# Patient Record
Sex: Female | Born: 1980 | Race: White | Hispanic: No | Marital: Married | Smoking: Current every day smoker
Health system: Southern US, Community
[De-identification: ages and names within clinical notes are randomized; demographics above are authoritative.]

## PROBLEM LIST (undated history)

## (undated) DIAGNOSIS — O24419 Gestational diabetes mellitus in pregnancy, unspecified control: Secondary | ICD-10-CM

## (undated) DIAGNOSIS — R000 Tachycardia, unspecified: Secondary | ICD-10-CM

## (undated) HISTORY — PX: HERNIA REPAIR: SHX51

## (undated) HISTORY — DX: Tachycardia, unspecified: R00.0

---

## 2004-01-28 ENCOUNTER — Other Ambulatory Visit: Admission: RE | Admit: 2004-01-28 | Discharge: 2004-01-28 | Payer: Self-pay | Admitting: Internal Medicine

## 2009-10-18 ENCOUNTER — Emergency Department (HOSPITAL_COMMUNITY): Admission: EM | Admit: 2009-10-18 | Discharge: 2009-10-18 | Payer: Self-pay | Admitting: Emergency Medicine

## 2010-06-19 LAB — CBC
HCT: 39.2 % (ref 36.0–46.0)
Hemoglobin: 13.1 g/dL (ref 12.0–15.0)
MCHC: 33.5 g/dL (ref 30.0–36.0)
MCV: 84.2 fL (ref 78.0–100.0)
Platelets: 280 10*3/uL (ref 150–400)
RBC: 4.66 MIL/uL (ref 3.87–5.11)
RDW: 13.9 % (ref 11.5–15.5)

## 2010-06-19 LAB — COMPREHENSIVE METABOLIC PANEL
AST: 17 U/L (ref 0–37)
Albumin: 3.5 g/dL (ref 3.5–5.2)
Alkaline Phosphatase: 63 U/L (ref 39–117)
CO2: 27 mEq/L (ref 19–32)
Chloride: 106 mEq/L (ref 96–112)
Creatinine, Ser: 0.93 mg/dL (ref 0.4–1.2)
GFR calc Af Amer: >60 mL/min (ref >60)
Sodium: 139 mEq/L (ref 135–145)
Total Bilirubin: 0.4 mg/dL (ref 0.3–1.2)

## 2010-06-19 LAB — URINE MICROSCOPIC-ADD ON

## 2010-06-19 LAB — DIFFERENTIAL
Eosinophils Relative: 2 % (ref 0–5)
Lymphs Abs: 3.3 10*3/uL (ref 0.7–4.0)

## 2010-06-19 LAB — URINALYSIS, ROUTINE W REFLEX MICROSCOPIC

## 2010-07-31 ENCOUNTER — Emergency Department (HOSPITAL_COMMUNITY)
Admission: EM | Admit: 2010-07-31 | Discharge: 2010-07-31 | Disposition: A | Discharge status: Home / Self Care | Payer: BC Managed Care – PPO | Attending: Emergency Medicine | Admitting: Emergency Medicine

## 2010-07-31 ENCOUNTER — Emergency Department (HOSPITAL_COMMUNITY): Payer: BC Managed Care – PPO

## 2010-07-31 DIAGNOSIS — R42 Dizziness and giddiness: Secondary | ICD-10-CM | POA: Insufficient documentation

## 2010-07-31 DIAGNOSIS — F17200 Nicotine dependence, unspecified, uncomplicated: Secondary | ICD-10-CM | POA: Insufficient documentation

## 2010-07-31 DIAGNOSIS — R0609 Other forms of dyspnea: Secondary | ICD-10-CM | POA: Insufficient documentation

## 2010-07-31 DIAGNOSIS — R0602 Shortness of breath: Secondary | ICD-10-CM | POA: Insufficient documentation

## 2010-07-31 DIAGNOSIS — R0989 Other specified symptoms and signs involving the circulatory and respiratory systems: Secondary | ICD-10-CM | POA: Insufficient documentation

## 2010-07-31 DIAGNOSIS — R079 Chest pain, unspecified: Secondary | ICD-10-CM | POA: Insufficient documentation

## 2010-07-31 LAB — POCT I-STAT, CHEM 8
Calcium, Ion: 1.08 mmol/L — ABNORMAL LOW (ref 1.12–1.32)
Creatinine, Ser: 0.8 mg/dL (ref 0.4–1.2)
Glucose, Bld: 113 mg/dL — ABNORMAL HIGH (ref 70–99)
HCT: 38 % (ref 36.0–46.0)
Sodium: 140 mEq/L (ref 135–145)
TCO2: 25 mmol/L (ref 0–100)

## 2010-07-31 LAB — D-DIMER, QUANTITATIVE: D-Dimer, Quant: 0.23 ug/mL-FEU (ref 0.00–0.48)

## 2010-08-04 ENCOUNTER — Encounter: Payer: Self-pay | Admitting: Cardiovascular Disease

## 2010-08-04 ENCOUNTER — Ambulatory Visit (INDEPENDENT_AMBULATORY_CARE_PROVIDER_SITE_OTHER): Payer: BC Managed Care – PPO | Admitting: Cardiovascular Disease

## 2010-08-04 VITALS — BP 126/82 | HR 86 | Ht 67.0 in | Wt 204.0 lb

## 2010-08-04 DIAGNOSIS — R000 Tachycardia, unspecified: Secondary | ICD-10-CM

## 2010-08-04 NOTE — Progress Notes (Signed)
Claire Jackson Date of Birth  02/01/1981 [facility] / [facility] [address]     [address] [place], [place]  [address] [phone]  Fax  [phone]  History of Present Illness:  Claire Jackson is a young female who presents today for further evaluation of chest pain and tachycardia. Claire Jackson has been overweight for a couple of years. She's been trying to lose some weight. She was tried on ephedrine. She noticed that her heart rate would stay elevated after she walked on the treadmill.   She never had the chest pain while walking on the treadmill but would have occasional episodes of chest discomfort. She seen in virtually him and workup there was negative.  She denies any syncope or presyncope.  No current outpatient prescriptions on file prior to visit.   No Known Allergies  Past Medical History  Diagnosis Date  . Heart rate fast     No past surgical history on file.  History  Smoking status  . Former Smoker  . Quit date: 08/03/2010  Smokeless tobacco  . Not on file    History  Alcohol Use No    Family History  Problem Relation Age of Onset  . Mitral valve prolapse Mother     Reviw of Systems:  Reviewed in the HPI.  All other systems are negative.  Physical Exam: BP 126/82  Pulse 86  Ht 5\' 7"  (1.702 m)  Wt 204 lb (92.534 kg)  BMI 31.95 kg/m2  LMP 07/03/2010 The patient is alert and oriented x 3.  The mood and affect are normal.  The skin is warm and dry.  Color is normal.  The HEENT exam reveals that the sclera are nonicteric.  The mucous membranes are moist.  The carotids are 2+ without bruits.  There is no thyromegaly.  There is no JVD.  The lungs are clear.  The chest wall is non tender.  The heart exam reveals a regular rate with a normal S1 and S2.  There are no murmurs, gallops, or rubs.  The PMI is not displaced.   Abdominal exam reveals good bowel sounds.  There is no guarding or rebound.  There is no hepatosplenomegaly or tenderness.   There are no masses.  Exam of the legs reveal no clubbing, cyanosis, or edema.  The legs are without rashes.  The distal pulses are intact.  Cranial nerves II - XII are intact.  Motor and sensory functions are intact.  The gait is normal.  ECG: Her EKG from the [facility] emergency room reveals normal sinus rhythm. She has no ST or T wave changes. Assessment / Plan:

## 2010-08-04 NOTE — Assessment & Plan Note (Signed)
Claire Jackson's heart rate was probably accelerated by a combination of several issues. She is somewhat deconditioned. In addition she was taking ephedrine which would tend to make her heart rate after. Her symptoms are much better after she stopped ephedrine. I've asked her to stop all diet pills. I've asked her to exercise on a regular basis. I've encouraged her to improve with a good exercise and weight loss program. I've not scheduled her a return appointment but would be happy see her in the future if needed.

## 2011-07-19 IMAGING — CT CT ABD-PELV W/ CM
3 of 4 series · 14 of 32 positions shown, 19 images · IV contrast (water & 80ml omni 300)
Comparison: None.

CLINICAL DATA: Abdominal pain and swelling.

CT ABDOMEN AND PELVIS WITH CONTRAST
TECHNIQUE: Multidetector CT imaging of the abdomen and pelvis was
performed following the standard protocol during bolus
administration of intravenous contrast.
Contrast: 80 ml Xmnipaque-QYY.

[Series 2: routine abdomen · axial · 0.88mm/px · z∈[-402,-117]mm · 4 of 96 slices shown, 9 images]
[im 20/96  soft-tissue]
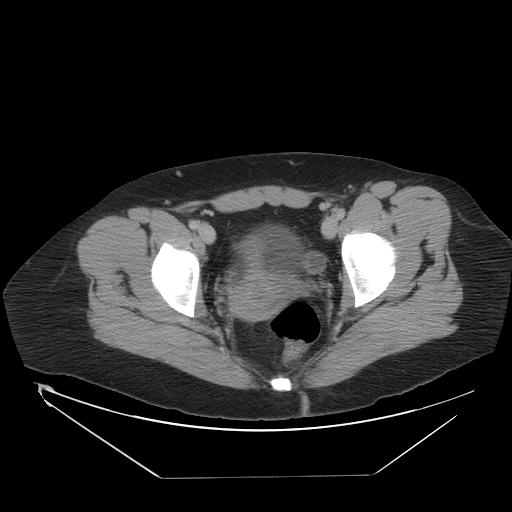
[im 20/96  lung]
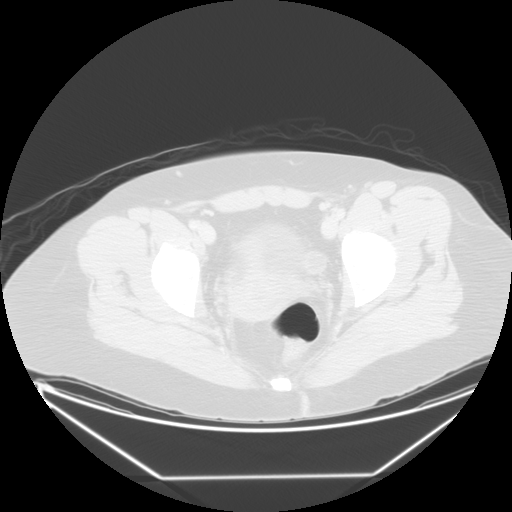
[im 20/96  bone]
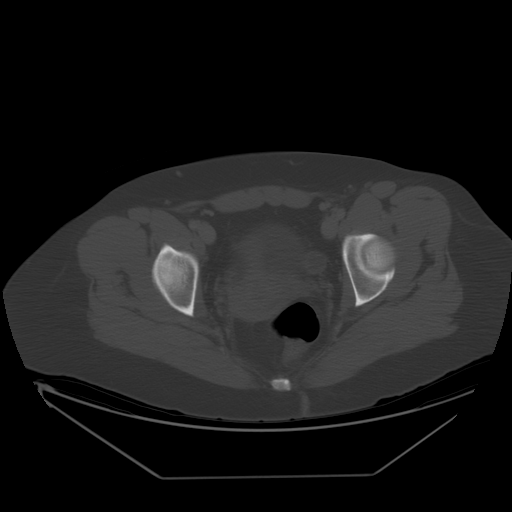
[im 39/96  soft-tissue]
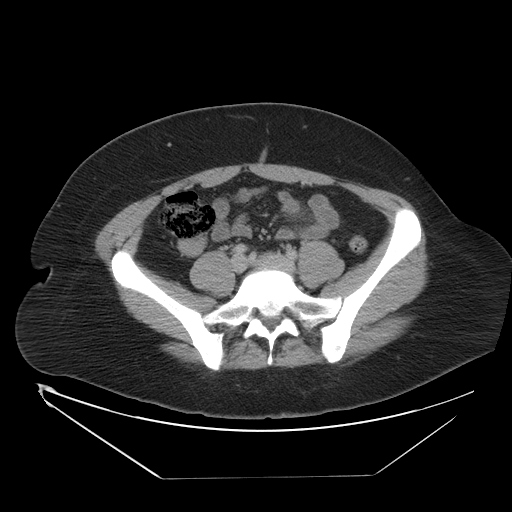
[im 39/96  lung]
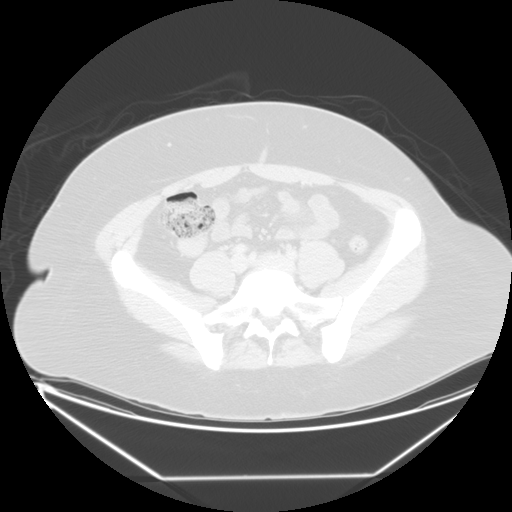
[im 58/96  soft-tissue]
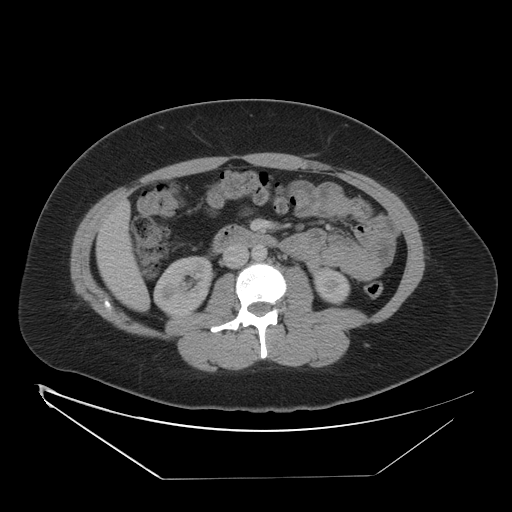
[im 58/96  lung]
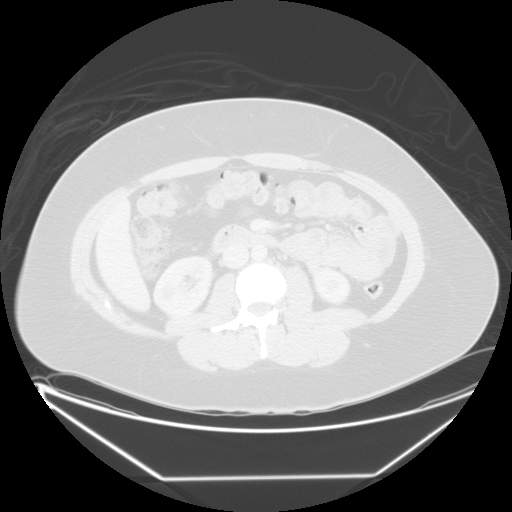
[im 77/96  soft-tissue]
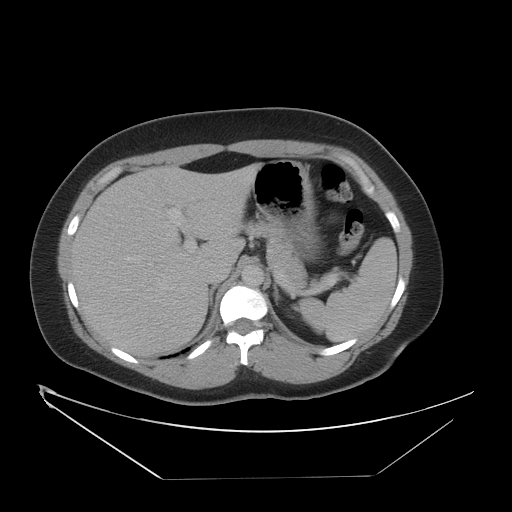
[im 77/96  lung]
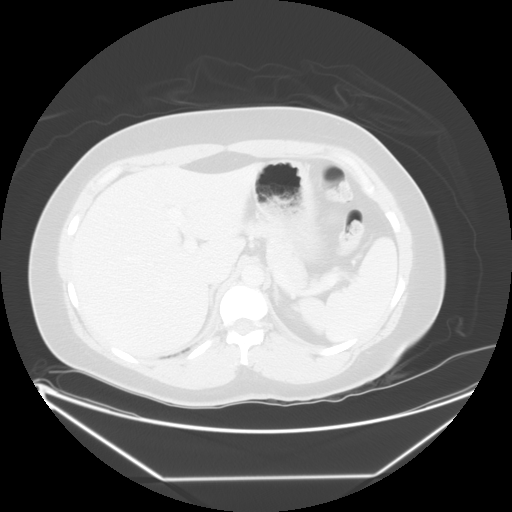

[Series 401: reformatted · coronal · 0.98mm/px · 2 of 161 slices shown (1 of 2)]
[im 18/161  soft-tissue]
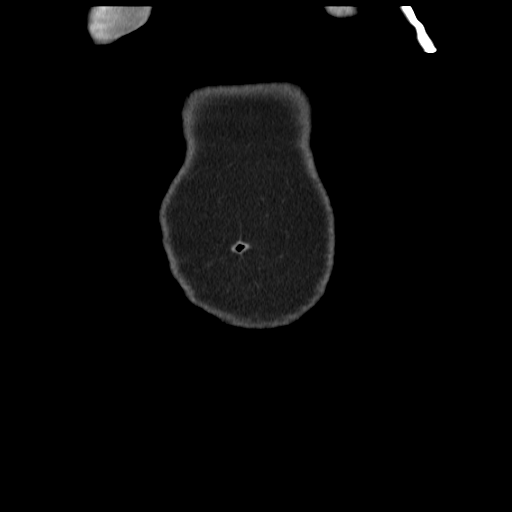
[im 36/161  soft-tissue]
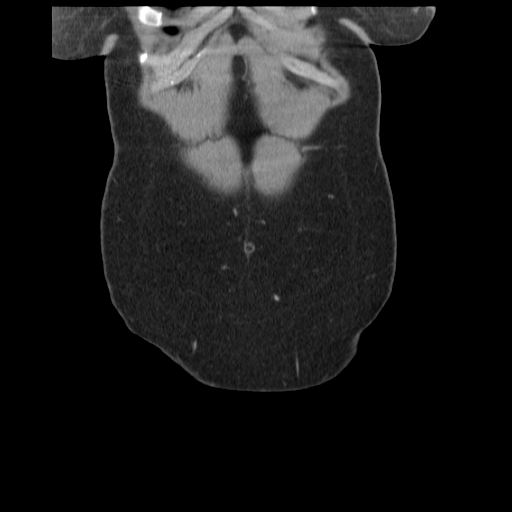

[Series 402: reformatted · sagittal · 0.98mm/px · 8 of 224 slices shown (2 of 2)]
[im 18/224  soft-tissue]
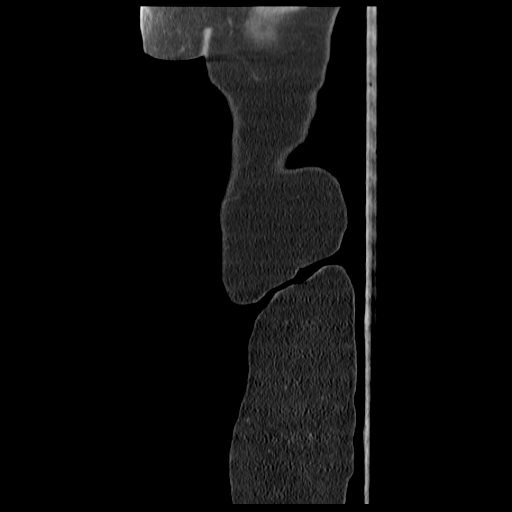
[im 52/224  soft-tissue]
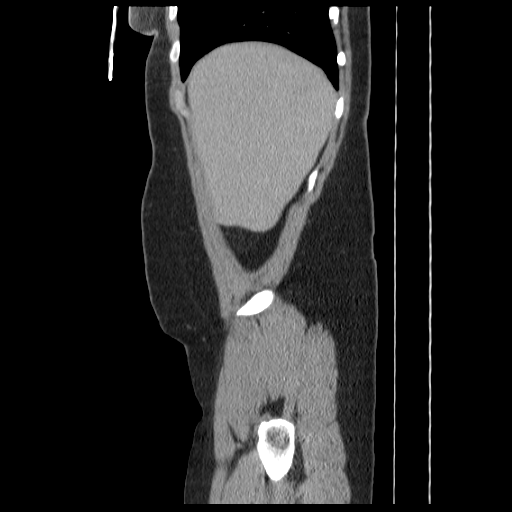
[im 69/224  soft-tissue]
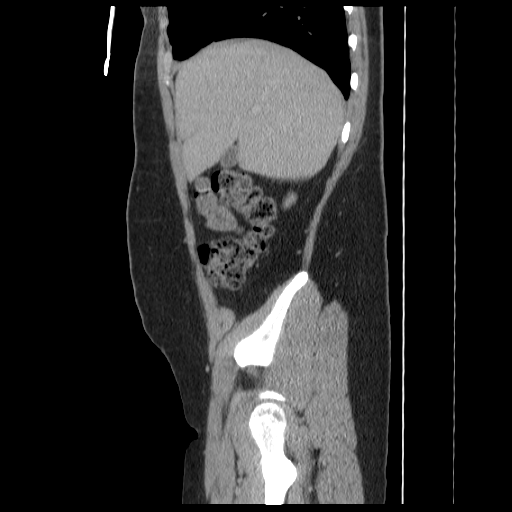
[im 103/224  soft-tissue]
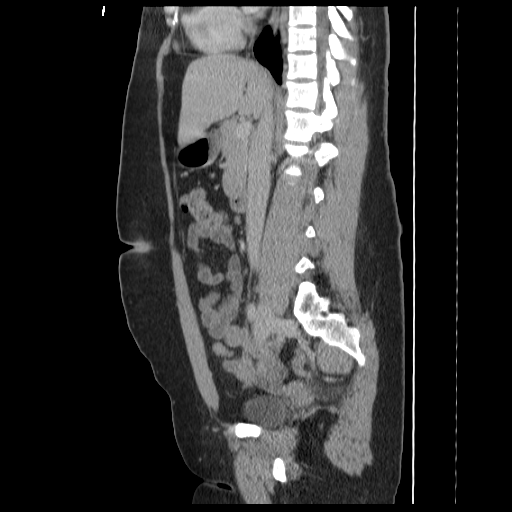
[im 121/224  soft-tissue]
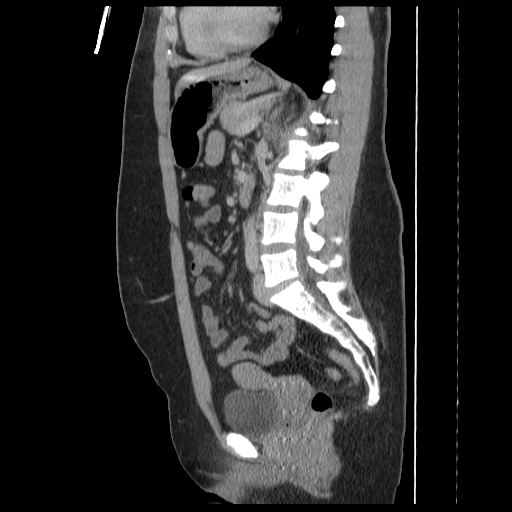
[im 155/224  soft-tissue]
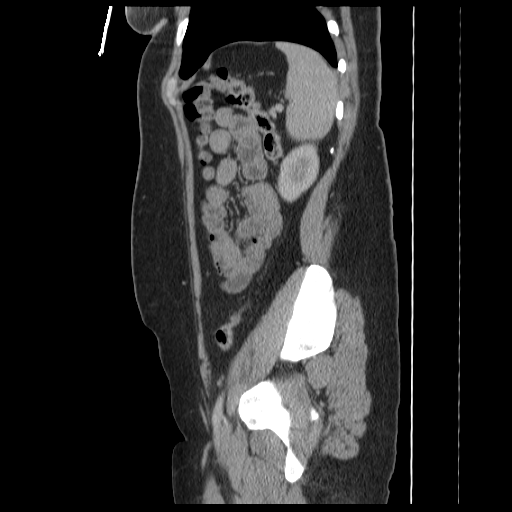
[im 172/224  soft-tissue]
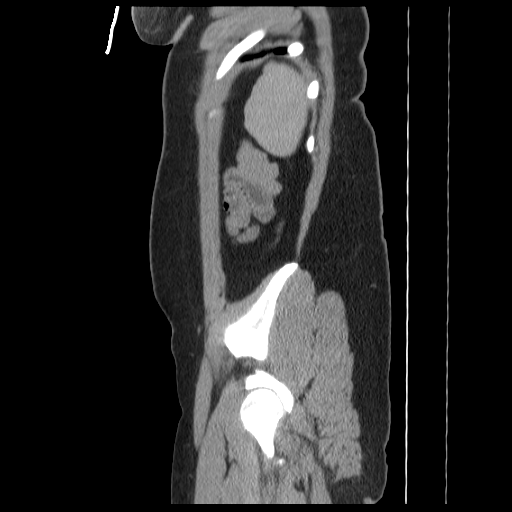
[im 206/224  soft-tissue]
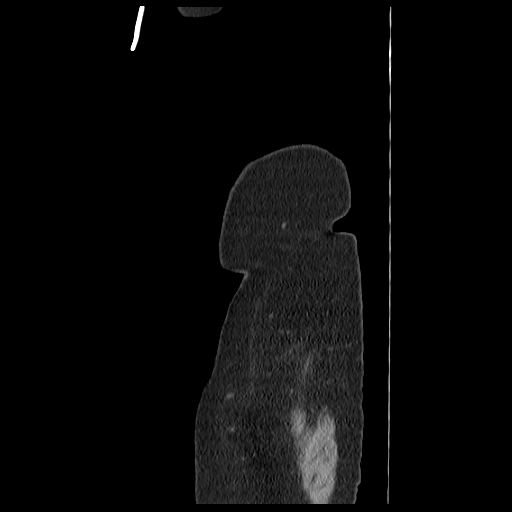

[14 of 32 positions shown; findings below may reference images not displayed]

FINDINGS: Dependent atelectasis of the lung bases.  Liver within
normal limits.  Gallbladder contracted.  The spleen unremarkable.
Normal kidneys.  Adrenal glands normal.  Pancreas unremarkable.
Stomach and proximal small bowel normal.  Small appendicolith.  No
inflammatory changes of the appendix.  No free fluid or free air.
The colon is unremarkable.  Physiologic appearance of the uterus
and adnexa.  Urinary bladder normal.  Both ureters appear within
normal limits.
IMPRESSION: No acute abnormality.

## 2011-09-16 ENCOUNTER — Ambulatory Visit: Payer: Self-pay

## 2011-09-16 ENCOUNTER — Other Ambulatory Visit: Payer: Self-pay | Admitting: Occupational Medicine

## 2011-09-16 DIAGNOSIS — R52 Pain, unspecified: Secondary | ICD-10-CM

## 2013-11-27 ENCOUNTER — Encounter (HOSPITAL_COMMUNITY): Payer: Self-pay | Admitting: Emergency Medicine

## 2013-11-27 ENCOUNTER — Emergency Department (HOSPITAL_COMMUNITY)
Admission: EM | Admit: 2013-11-27 | Discharge: 2013-11-27 | Disposition: A | Discharge status: Home / Self Care | Payer: 59 | Attending: Emergency Medicine | Admitting: Emergency Medicine

## 2013-11-27 DIAGNOSIS — R209 Unspecified disturbances of skin sensation: Secondary | ICD-10-CM | POA: Insufficient documentation

## 2013-11-27 DIAGNOSIS — R12 Heartburn: Secondary | ICD-10-CM | POA: Insufficient documentation

## 2013-11-27 DIAGNOSIS — R0789 Other chest pain: Secondary | ICD-10-CM

## 2013-11-27 DIAGNOSIS — Z3202 Encounter for pregnancy test, result negative: Secondary | ICD-10-CM | POA: Diagnosis not present

## 2013-11-27 DIAGNOSIS — R071 Chest pain on breathing: Secondary | ICD-10-CM | POA: Insufficient documentation

## 2013-11-27 DIAGNOSIS — R110 Nausea: Secondary | ICD-10-CM | POA: Insufficient documentation

## 2013-11-27 DIAGNOSIS — R079 Chest pain, unspecified: Secondary | ICD-10-CM | POA: Insufficient documentation

## 2013-11-27 DIAGNOSIS — Z87891 Personal history of nicotine dependence: Secondary | ICD-10-CM | POA: Insufficient documentation

## 2013-11-27 LAB — LIPASE, BLOOD: LIPASE: 21 U/L (ref 11–59)

## 2013-11-27 LAB — HEPATIC FUNCTION PANEL
ALBUMIN: 4 g/dL (ref 3.5–5.2)
ALT: 20 U/L (ref 0–35)
AST: 28 U/L (ref 0–37)
Alkaline Phosphatase: 69 U/L (ref 39–117)
BILIRUBIN TOTAL: 0.3 mg/dL (ref 0.3–1.2)
Total Protein: 7.2 g/dL (ref 6.0–8.3)

## 2013-11-27 LAB — URINALYSIS, ROUTINE W REFLEX MICROSCOPIC
BILIRUBIN URINE: NEGATIVE
Glucose, UA: NEGATIVE mg/dL
KETONES UR: NEGATIVE mg/dL
Leukocytes, UA: NEGATIVE
Nitrite: NEGATIVE
PH: 5.5 (ref 5.0–8.0)
Protein, ur: NEGATIVE mg/dL
SPECIFIC GRAVITY, URINE: 1.008 (ref 1.005–1.030)
UROBILINOGEN UA: 0.2 mg/dL (ref 0.0–1.0)

## 2013-11-27 LAB — BASIC METABOLIC PANEL
ANION GAP: 14 (ref 5–15)
BUN: 7 mg/dL (ref 6–23)
CHLORIDE: 100 meq/L (ref 96–112)
CO2: 23 mEq/L (ref 19–32)
Calcium: 8.8 mg/dL (ref 8.4–10.5)
Creatinine, Ser: 0.83 mg/dL (ref 0.50–1.10)
GFR calc Af Amer: >90 mL/min (ref >90)
Glucose, Bld: 88 mg/dL (ref 70–99)
Potassium: 4.1 mEq/L (ref 3.7–5.3)
SODIUM: 137 meq/L (ref 137–147)

## 2013-11-27 LAB — URINE MICROSCOPIC-ADD ON

## 2013-11-27 LAB — CBC
HCT: 39.6 % (ref 36.0–46.0)
Hemoglobin: 12.9 g/dL (ref 12.0–15.0)
MCH: 26.8 pg (ref 26.0–34.0)
MCHC: 32.6 g/dL (ref 30.0–36.0)
MCV: 82.2 fL (ref 78.0–100.0)
Platelets: 282 10*3/uL (ref 150–400)
RBC: 4.82 MIL/uL (ref 3.87–5.11)
RDW: 13.8 % (ref 11.5–15.5)
WBC: 10.1 10*3/uL (ref 4.0–10.5)

## 2013-11-27 LAB — I-STAT TROPONIN, ED
TROPONIN I, POC: 0 ng/mL (ref 0.00–0.08)
TROPONIN I, POC: 0 ng/mL (ref 0.00–0.08)

## 2013-11-27 LAB — CBG MONITORING, ED: GLUCOSE-CAPILLARY: 89 mg/dL (ref 70–99)

## 2013-11-27 LAB — POC URINE PREG, ED: Preg Test, Ur: NEGATIVE

## 2013-11-27 LAB — D-DIMER, QUANTITATIVE (NOT AT ARMC): D DIMER QUANT: 0.35 ug{FEU}/mL (ref 0.00–0.48)

## 2013-11-27 MED ORDER — KETOROLAC TROMETHAMINE 15 MG/ML IJ SOLN
15.0000 mg | Freq: Once | INTRAMUSCULAR | Status: AC
Start: 2013-11-27 — End: 2013-11-27
  Administered 2013-11-27: 15 mg via INTRAVENOUS
  Filled 2013-11-27: qty 1

## 2013-11-27 MED ORDER — GI COCKTAIL ~~LOC~~
30.0000 mL | Freq: Once | ORAL | Status: AC
  Administered 2013-11-27: 30 mL via ORAL
  Filled 2013-11-27: qty 30

## 2013-11-27 NOTE — Discharge Instructions (Signed)

## 2013-11-27 NOTE — ED Notes (Addendum)
Pt reports mid sternal cp (pressure) that started at 1450 today. Shortly after she started having sob, nausea and numbness/tingling to L hand. Tingling is up to elbow now. Also tingling in neck. Cp increases with deep breathing. Was told by urgent care to come to ER. Pt conscious alert&oriented. Pt also sts she has lump to L ribs that she was going to go to doctor for later this week. Pt given 324 asa pta.

## 2013-11-27 NOTE — ED Notes (Signed)
Dr. Gwendolyn Grant in to examine pt. No code stroke at this time.

## 2013-11-27 NOTE — ED Provider Notes (Signed)
CSN: [id number]     Arrival date & time 11/27/13  1638 History   First MD Initiated Contact with Patient 11/27/13 1751     Chief Complaint  Patient presents with  . Chest Pain  . Numbness    left arm     (Consider location/radiation/quality/duration/timing/severity/associated sxs/prior Treatment) Patient is a 33 y.o. female presenting with chest pain.  Chest Pain Pain location:  Substernal area Pain quality: pressure   Pain radiates to:  Does not radiate Pain radiates to the back: no   Pain severity:  Moderate Onset quality:  Gradual Duration:  1 day Timing:  Constant Progression:  Partially resolved Chronicity:  New Context: eating   Relieved by:  Nothing Worsened by:  Nothing tried Ineffective treatments:  None tried Associated symptoms: heartburn and nausea   Associated symptoms: no abdominal pain, no back pain, no cough, no dizziness, no fever, no lower extremity edema, no numbness, no shortness of breath and not vomiting     Past Medical History  Diagnosis Date  . Heart rate fast    History reviewed. No pertinent past surgical history. Family History  Problem Relation Age of Onset  . Mitral valve prolapse Mother    History  Substance Use Topics  . Smoking status: Former Smoker    Quit date: 08/03/2010  . Smokeless tobacco: Not on file  . Alcohol Use: No   OB History   Grav Para Term Preterm Abortions TAB SAB Ect Mult Living                 Review of Systems  Constitutional: Negative for fever and chills.  HENT: Negative for congestion, rhinorrhea and sore throat.   Eyes: Negative for photophobia and visual disturbance.  Respiratory: Negative for cough and shortness of breath.   Cardiovascular: Positive for chest pain. Negative for leg swelling.  Gastrointestinal: Positive for heartburn and nausea. Negative for vomiting, abdominal pain, diarrhea and constipation.  Endocrine: Negative for polyphagia and polyuria.  Genitourinary: Negative for dysuria,  flank pain, vaginal bleeding, vaginal discharge and enuresis.  Musculoskeletal: Negative for back pain and gait problem.  Skin: Negative for color change and rash.  Neurological: Negative for dizziness, syncope, light-headedness and numbness.  Hematological: Negative for adenopathy. Does not bruise/bleed easily.  All other systems reviewed and are negative.     Allergies  Review of patient's allergies indicates no known allergies.  Home Medications   Prior to Admission medications   Not on File   BP 110/71  Pulse 74  Temp(Src) 98.4 F (36.9 C) (Oral)  Resp 19  SpO2 99%  LMP 11/15/2013 Physical Exam  Vitals reviewed. Constitutional: She is oriented to person, place, and time. She appears well-developed and well-nourished.  HENT:  Head: Normocephalic and atraumatic.  Right Ear: External ear normal.  Left Ear: External ear normal.  Eyes: Conjunctivae and EOM are normal. Pupils are equal, round, and reactive to light.  Neck: Normal range of motion. Neck supple.  Cardiovascular: Normal rate, regular rhythm, normal heart sounds and intact distal pulses.   Pulmonary/Chest: Effort normal and breath sounds normal.  Abdominal: Soft. Bowel sounds are normal. There is no tenderness.  Musculoskeletal: Normal range of motion.  Neurological: She is alert and oriented to person, place, and time.  Skin: Skin is warm and dry.    ED Course  Procedures (including critical care time) Labs Review Labs Reviewed  URINALYSIS, ROUTINE W REFLEX MICROSCOPIC - Abnormal; Notable for the following:    Hgb urine dipstick SMALL (*)  All other components within normal limits  URINE MICROSCOPIC-ADD ON - Abnormal; Notable for the following:    Squamous Epithelial / LPF FEW (*)    All other components within normal limits  CBC  BASIC METABOLIC PANEL  HEPATIC FUNCTION PANEL  LIPASE, BLOOD  D-DIMER, QUANTITATIVE  I-STAT TROPOININ, ED  CBG MONITORING, ED  POC URINE PREG, ED  I-STAT TROPOININ,  ED    Imaging Review No results found.   EKG Interpretation   Date/Time:  Wednesday November 27 2013 16:45:04 EDT Ventricular Rate:  89 PR Interval:  166 QRS Duration: 88 QT Interval:  372 QTC Calculation: 452 R Axis:   83 Text Interpretation:  Normal sinus rhythm Normal ECG No significant change  was found Confirmed by Mirian Mo [phone]) on 11/27/2013 6:13:19 PM      MDM   Final diagnoses:  Chest pain, unspecified chest pain type  Chest wall pain    33 y.o. female  with pertinent PMH of prior tachycardia presents with chest pain, intermittent, associated with dyspnea.  She had some tingling in her l arm so came for further evaluation.  Symptoms non exertional, and she felt epigastric pain and need to burp.  She was given a gi cocktail and toradol and had near total relief of symptoms.  Labs as above drawn given pleuritic nature of pain and ? Swelling of legs after long car trip.  Paroxysmal nature within 6 hours, so delta troponin drawn as well. .    Labs and imaging as above reviewed. Delta troponin negative, ddimer negative, ECG as above with NSR.  Pt complained of a lump on L rib, which I can feel on palpation, however this is nonspecific and she has PCP fu for same.  Pt was given standard return precautions for chest pain, voiced understanding, and agreed to fu.  Likely reflux vs MSK.    1. Chest pain, unspecified chest pain type   2. Chest wall pain         Mirian Mo, MD 11/27/13 [phone]

## 2017-01-05 ENCOUNTER — Encounter (HOSPITAL_COMMUNITY): Payer: Self-pay | Admitting: Emergency Medicine

## 2017-01-05 ENCOUNTER — Emergency Department (HOSPITAL_COMMUNITY)
Admission: EM | Admit: 2017-01-05 | Discharge: 2017-01-06 | Disposition: A | Discharge status: Home / Self Care | Payer: Commercial Managed Care - PPO | Attending: Emergency Medicine | Admitting: Emergency Medicine

## 2017-01-05 DIAGNOSIS — R109 Unspecified abdominal pain: Secondary | ICD-10-CM | POA: Diagnosis not present

## 2017-01-05 DIAGNOSIS — Z87891 Personal history of nicotine dependence: Secondary | ICD-10-CM | POA: Insufficient documentation

## 2017-01-05 DIAGNOSIS — M545 Low back pain, unspecified: Secondary | ICD-10-CM

## 2017-01-05 LAB — COMPREHENSIVE METABOLIC PANEL
ALT: 16 U/L (ref 14–54)
AST: 19 U/L (ref 15–41)
Albumin: 3.9 g/dL (ref 3.5–5.0)
Alkaline Phosphatase: 73 U/L (ref 38–126)
Anion gap: 9 (ref 5–15)
BUN: 10 mg/dL (ref 6–20)
CHLORIDE: 105 mmol/L (ref 101–111)
CO2: 22 mmol/L (ref 22–32)
Calcium: 8.9 mg/dL (ref 8.9–10.3)
Creatinine, Ser: 0.98 mg/dL (ref 0.44–1.00)
GFR calc Af Amer: >60 mL/min (ref >60)
GFR calc non Af Amer: >60 mL/min (ref >60)
GLUCOSE: 136 mg/dL — AB (ref 65–99)
POTASSIUM: 3.9 mmol/L (ref 3.5–5.1)
SODIUM: 136 mmol/L (ref 135–145)
Total Bilirubin: 0.5 mg/dL (ref 0.3–1.2)
Total Protein: 6.9 g/dL (ref 6.5–8.1)

## 2017-01-05 LAB — URINALYSIS, ROUTINE W REFLEX MICROSCOPIC
BILIRUBIN URINE: NEGATIVE
Glucose, UA: NEGATIVE mg/dL
Ketones, ur: NEGATIVE mg/dL
LEUKOCYTES UA: NEGATIVE
NITRITE: NEGATIVE
Protein, ur: NEGATIVE mg/dL
Specific Gravity, Urine: 1.031 — ABNORMAL HIGH (ref 1.005–1.030)
pH: 5 (ref 5.0–8.0)

## 2017-01-05 LAB — CBC WITH DIFFERENTIAL/PLATELET
BASOS ABS: 0 10*3/uL (ref 0.0–0.1)
Basophils Relative: 0 %
EOS ABS: 0.3 10*3/uL (ref 0.0–0.7)
Eosinophils Relative: 2 %
HCT: 38.2 % (ref 36.0–46.0)
Hemoglobin: 12.3 g/dL (ref 12.0–15.0)
LYMPHS PCT: 21 %
Lymphs Abs: 3 10*3/uL (ref 0.7–4.0)
MCH: 25.8 pg — ABNORMAL LOW (ref 26.0–34.0)
MCHC: 32.2 g/dL (ref 30.0–36.0)
MCV: 80.3 fL (ref 78.0–100.0)
Monocytes Absolute: 0.8 10*3/uL (ref 0.1–1.0)
Monocytes Relative: 6 %
Neutro Abs: 10 10*3/uL — ABNORMAL HIGH (ref 1.7–7.7)
Neutrophils Relative %: 71 %
PLATELETS: 310 10*3/uL (ref 150–400)
RBC: 4.76 MIL/uL (ref 3.87–5.11)
RDW: 15.3 % (ref 11.5–15.5)
WBC: 14.1 10*3/uL — AB (ref 4.0–10.5)

## 2017-01-05 LAB — I-STAT BETA HCG BLOOD, ED (MC, WL, AP ONLY): I-stat hCG, quantitative: <5 m[IU]/mL (ref <5)

## 2017-01-05 NOTE — ED Triage Notes (Signed)
Patient reports low back pain for 2 weeks with mild malodorous urine , denies back injury , seen at an urgent care this morning at [place] suspects diverticulitis , denies emesis /fever this afternoon .

## 2017-01-06 ENCOUNTER — Emergency Department (HOSPITAL_COMMUNITY): Payer: Commercial Managed Care - PPO

## 2017-01-06 DIAGNOSIS — M545 Low back pain: Secondary | ICD-10-CM | POA: Diagnosis present

## 2017-01-06 DIAGNOSIS — R109 Unspecified abdominal pain: Secondary | ICD-10-CM | POA: Diagnosis not present

## 2017-01-06 DIAGNOSIS — Z87891 Personal history of nicotine dependence: Secondary | ICD-10-CM | POA: Diagnosis not present

## 2017-01-06 MED ORDER — KETOROLAC TROMETHAMINE 30 MG/ML IJ SOLN
30.0000 mg | Freq: Once | INTRAMUSCULAR | Status: AC
  Administered 2017-01-06: 30 mg via INTRAVENOUS
  Filled 2017-01-06: qty 1

## 2017-01-06 MED ORDER — METHOCARBAMOL 500 MG PO TABS: 500.0000 mg | ORAL_TABLET | Freq: Two times a day (BID) | ORAL | 0 refills | Status: DC

## 2017-01-06 MED ORDER — NAPROXEN 500 MG PO TABS: 500.0000 mg | ORAL_TABLET | Freq: Two times a day (BID) | ORAL | 0 refills | Status: AC

## 2017-01-06 MED ORDER — IOPAMIDOL (ISOVUE-300) INJECTION 61%
INTRAVENOUS | Status: AC
  Administered 2017-01-06: 100 mL
  Filled 2017-01-06: qty 100

## 2017-01-06 NOTE — Discharge Instructions (Signed)
Take the prescribed medication as directed.  Can also use heating pad/warm compress to see if it will help your back as well. Follow-up with your primary care doctor if any ongoing issues. Return to the ED for new or worsening symptoms.

## 2017-01-06 NOTE — ED Provider Notes (Signed)
[facility]-EMERGENCY DEPT Provider Note   CSN: [id number] Arrival date & time: 01/05/17  2000     History   Chief Complaint Chief Complaint  Patient presents with  . Back Pain  . Diverticulitis    HPI Claire Jackson is a 36 y.o. female.  The history is provided by the patient and medical records.    36 year old female sent here from urgent care with concern of possible diverticulitis. Reports she has had some ongoing back pain for the past 2 weeks. States mostly localized to the lower mid back along the spine. No injury, trauma, or falls. She reports she is also had some lower abdominal pain and decreased urine output. States she is urinating less at one time and less frequently than normal. She denies any dysuria or noted hematuria. She did have some nausea earlier today but no vomiting. No diarrhea. States at urgent care she had blood work done which showed an elevated white blood cell count and she had a fever of 101F.  No hx of diverticulitis or other abdominal problems.  Does report pinched nerve in her back about 17 years ago but no ongoing issues from this.  No numbness/weakness of the legs.  No bowel or bladder incontinence.  Past Medical History:  Diagnosis Date  . Heart rate fast     Patient Active Problem List   Diagnosis Date Noted  . Heart rate fast     History reviewed. No pertinent surgical history.  OB History    No data available       Home Medications    Prior to Admission medications   Not on File    Family History Family History  Problem Relation Age of Onset  . Mitral valve prolapse Mother     Social History Social History  Substance Use Topics  . Smoking status: Former Smoker    Quit date: 08/03/2010  . Smokeless tobacco: Never Used  . Alcohol use No     Allergies   Patient has no known allergies.   Review of Systems Review of Systems  Gastrointestinal: Positive for abdominal pain.  Musculoskeletal: Positive for back pain.  All other  systems reviewed and are negative.    Physical Exam Updated Vital Signs BP (!) 154/83   Pulse (!) 104   Temp 98.3 F (36.8 C) (Oral)   Resp 16   Ht  (1.702 m)   Wt 117.9 kg (260 lb)   LMP 12/16/2016   SpO2 100%   BMI 40.72 kg/m   Physical Exam  Constitutional: She is oriented to person, place, and time. She appears well-developed and well-nourished.  HENT:  Head: Normocephalic and atraumatic.  Mouth/Throat: Oropharynx is clear and moist.  Eyes: Pupils are equal, round, and reactive to light. Conjunctivae and EOM are normal.  Neck: Normal range of motion.  Cardiovascular: Normal rate, regular rhythm and normal heart sounds.   Pulmonary/Chest: Effort normal and breath sounds normal.  Abdominal: Soft. Bowel sounds are normal. There is tenderness in the right lower quadrant, suprapubic area and left lower quadrant. There is no CVA tenderness.  Musculoskeletal: Normal range of motion.  Reports pain along LS in the midline, no focal tenderness or deformities present, no signs of trauma, normal strength/sensation of both legs, normal gait  Neurological: She is alert and oriented to person, place, and time.  Skin: Skin is warm and dry.  Psychiatric: She has a normal mood and affect.  Nursing note and vitals reviewed.    ED Treatments /  Results  Labs (all labs ordered are listed, but only abnormal results are displayed) Labs Reviewed  CBC WITH DIFFERENTIAL/PLATELET - Abnormal; Notable for the following:       Result Value   WBC 14.1 (*)    MCH 25.8 (*)    Neutro Abs 10.0 (*)    All other components within normal limits  COMPREHENSIVE METABOLIC PANEL - Abnormal; Notable for the following:    Glucose, Bld 136 (*)    All other components within normal limits  URINALYSIS, ROUTINE W REFLEX MICROSCOPIC - Abnormal; Notable for the following:    Specific Gravity, Urine 1.031 (*)    Hgb urine dipstick MODERATE (*)    Bacteria, UA RARE (*)    Squamous Epithelial / LPF 0-5 (*)     All other components within normal limits  I-STAT BETA HCG BLOOD, ED ([facility], [facility], [facility] ONLY)    EKG  EKG Interpretation None       Radiology Ct Abdomen Pelvis W Contrast  Result Date: 01/06/2017 CLINICAL DATA:  Low back pain for 2 weeks. Suspected diverticulitis. EXAM: CT ABDOMEN AND PELVIS WITH CONTRAST TECHNIQUE: Multidetector CT imaging of the abdomen and pelvis was performed using the standard protocol following bolus administration of intravenous contrast. CONTRAST:  ISOVUE-300 IOPAMIDOL (ISOVUE-300) INJECTION 61% COMPARISON:  CT abdomen and pelvis October 18, 2009 FINDINGS: LOWER CHEST: Lung bases are clear. Included heart size is normal. No pericardial effusion. HEPATOBILIARY: 5 mm hypodensity RIGHT lobe of the liver, possible cyst. Contracted nonacute gallbladder. PANCREAS: Normal. SPLEEN: Normal. ADRENALS/URINARY TRACT: Kidneys are orthotopic, demonstrating symmetric enhancement. No nephrolithiasis, hydronephrosis or solid renal masses. 11 mm LEFT interpolar cyst. The unopacified ureters are normal in course and caliber. Urinary bladder is partially distended and unremarkable. Normal adrenal glands. STOMACH/BOWEL: Tiny hiatal hernia. The stomach, small and large bowel are normal in course and caliber without inflammatory changes. A few scattered colonic diverticulosis seen. Normal appendix. VASCULAR/LYMPHATIC: Aortoiliac vessels are normal in course and caliber. Greater than expected number of small mesenteric lymph nodes are similar, likely reactive. No lymphadenopathy by CT size criteria. REPRODUCTIVE: Normal. OTHER: No intraperitoneal free fluid or free air. MUSCULOSKELETAL: Nonacute.  Negative lumbar spine. IMPRESSION: 1. Mild amount of retained large bowel stool, no bowel obstruction. 2. No acute intra-abdominal or pelvic process. Minimal colonic diverticulosis. Electronically Signed   By: Awilda Metro M.D.   On: 01/06/2017 01:49    Procedures Procedures (including critical care  time)  Medications Ordered in ED Medications - No data to display   Initial Impression / Assessment and Plan / ED Course  I have reviewed the triage vital signs and the nursing notes.  Pertinent labs & imaging results that were available during my care of the patient were reviewed by me and considered in my medical decision making (see chart for details).  36 year old female here with lower abdominal and back pain. She was sent in from urgent care with concern of diverticulitis. She is afebrile and nontoxic. Her abdomen is soft and benign. Does have some mild tenderness across the lower portion. No peritoneal signs. Back pain without any focal neurologic deficits here. No tenderness or deformities noted on exam. No reported injuries or trauma.  Does have a slight leukocytosis of 14. UA with blood but overall noninfectious. Does report she is starting her menses so blood likely from that.   CT scan was obtained for evaluation of both, no acute findings noted. Mild amount of obtained stool.  Patient feeling better here after treatment  with Toradol. We'll plan to discharge home with anti-inflammatories and muscle relaxer. Can follow-up with PCP for any ongoing issues.  Discussed plan with patient, she acknowledged understanding and agreed with plan of care.  Return precautions given for new or worsening symptoms.  Final Clinical Impressions(s) / ED Diagnoses   Final diagnoses:  Acute midline low back pain without sciatica  Abdominal pain, unspecified abdominal location    New Prescriptions Discharge Medication List as of 01/06/2017  2:02 AM    START taking these medications   Details  methocarbamol (ROBAXIN) 500 MG tablet Take 1 tablet (500 mg total) by mouth 2 (two) times daily., Starting Fri 01/06/2017, Print    naproxen (NAPROSYN) 500 MG tablet Take 1 tablet (500 mg total) by mouth 2 (two) times daily with a meal., Starting Fri 01/06/2017, Print         Garlon Hatchet, PA-C 01/06/17  0256    Azalia Bilis, MD 01/06/17 [phone]

## 2018-10-07 IMAGING — CT CT ABD-PELV W/ CM
2 of 4 series · 17 of 46 positions shown, 19 images · IV contrast (iopamidol)
Comparison: CT abdomen and pelvis October 18, 2009

CLINICAL DATA: Low back pain for 2 weeks. Suspected diverticulitis.

EXAM:
CT ABDOMEN AND PELVIS WITH CONTRAST
TECHNIQUE: Multidetector CT imaging of the abdomen and pelvis was performed
using the standard protocol following bolus administration of
intravenous contrast.
CONTRAST:  100mL HJRVRL-PQQ IOPAMIDOL (HJRVRL-PQQ) INJECTION 61%

[Series 3: abdomen 5.0 · axial · 0.98mm/px · z∈[+878,+1343]mm · 14 of 105 slices shown, 16 images]
[im 6/105  soft-tissue]
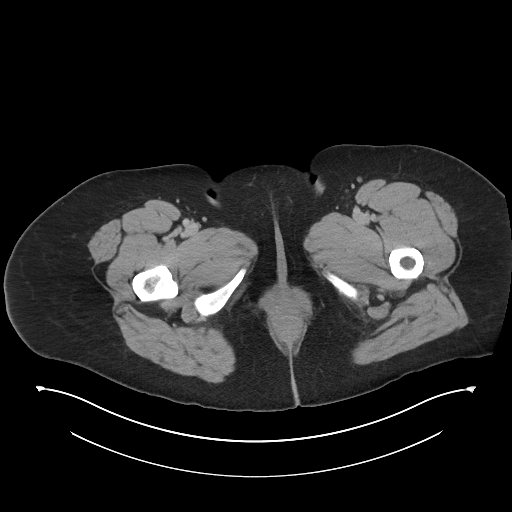
[im 6/105  bone]
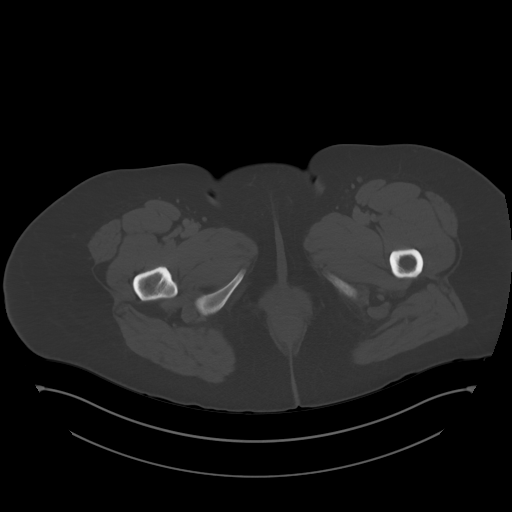
[im 11/105  soft-tissue]
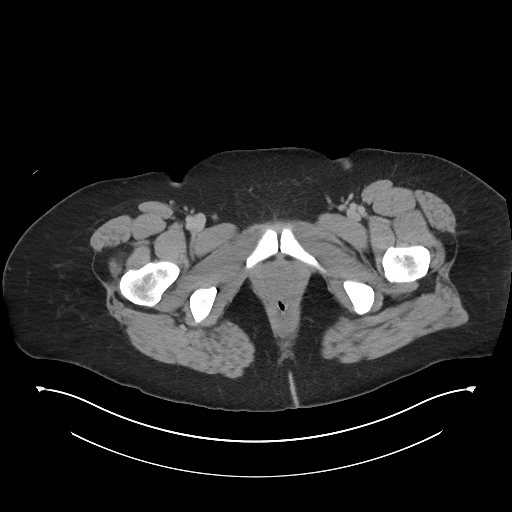
[im 22/105  soft-tissue]
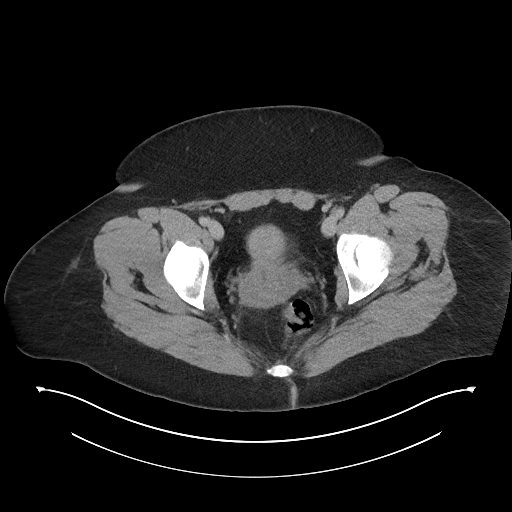
[im 28/105  soft-tissue]
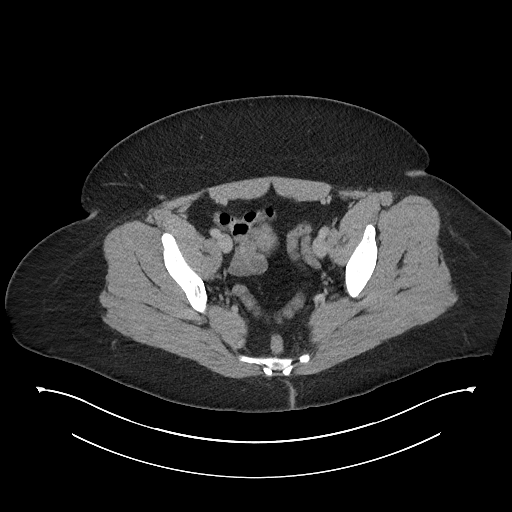
[im 33/105  soft-tissue]
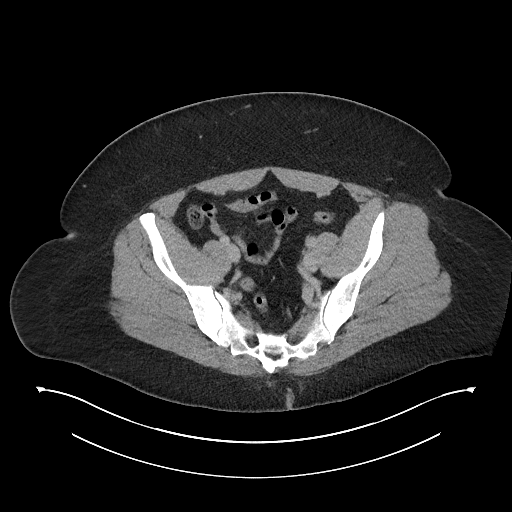
[im 44/105  soft-tissue]
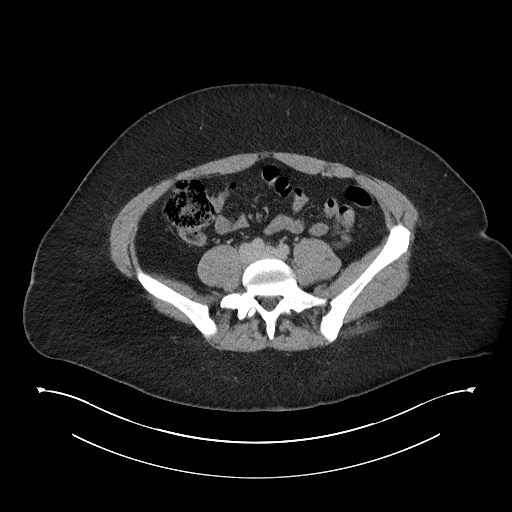
[im 50/105  soft-tissue]
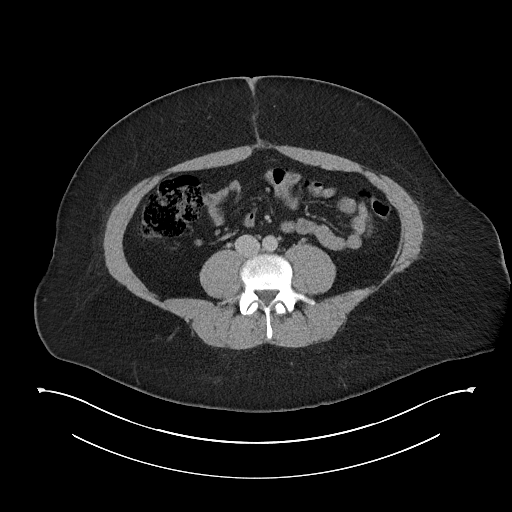
[im 55/105  soft-tissue]
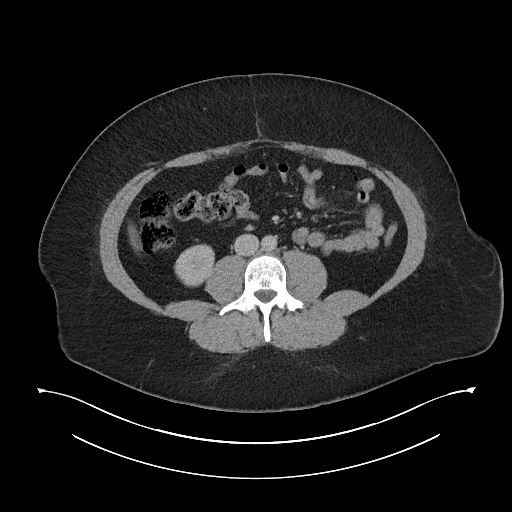
[im 61/105  soft-tissue]
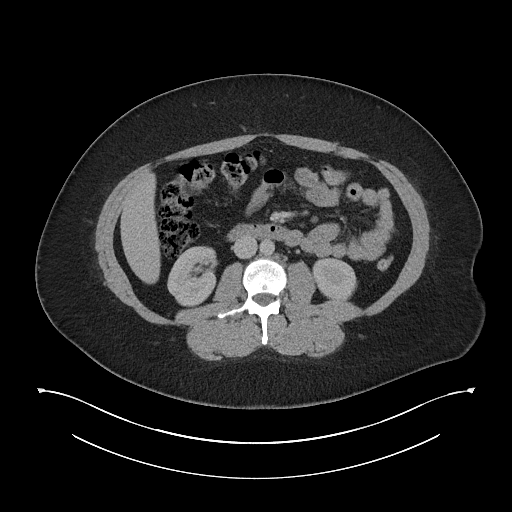
[im 61/105  bone]
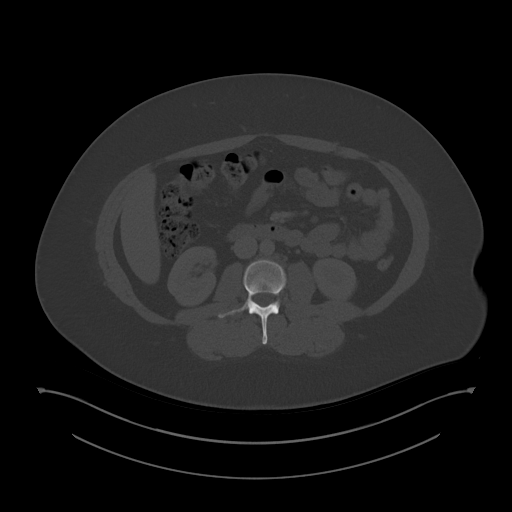
[im 72/105  soft-tissue]
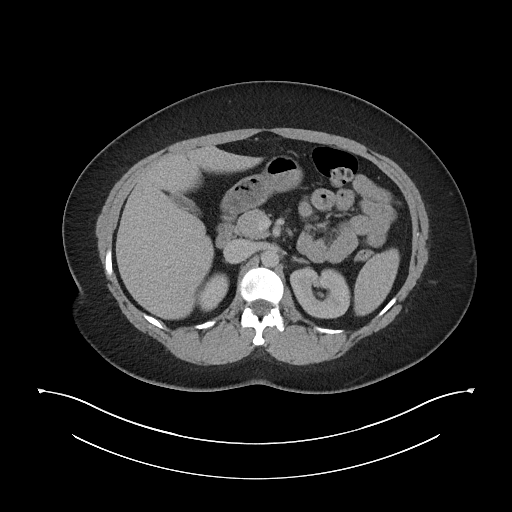
[im 77/105  soft-tissue]
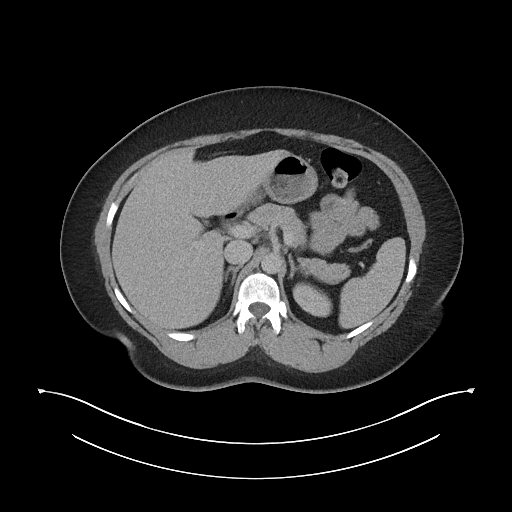
[im 83/105  soft-tissue]
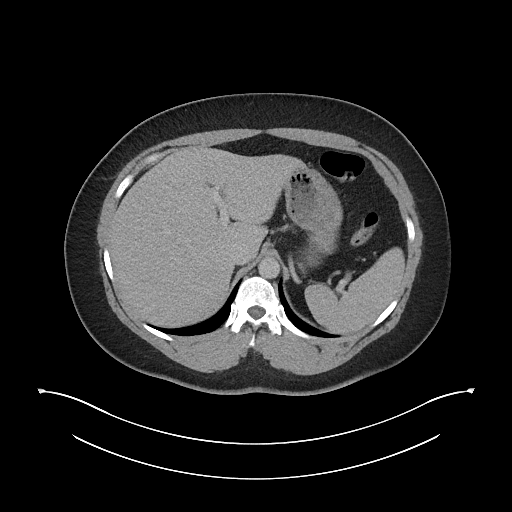
[im 94/105  soft-tissue]
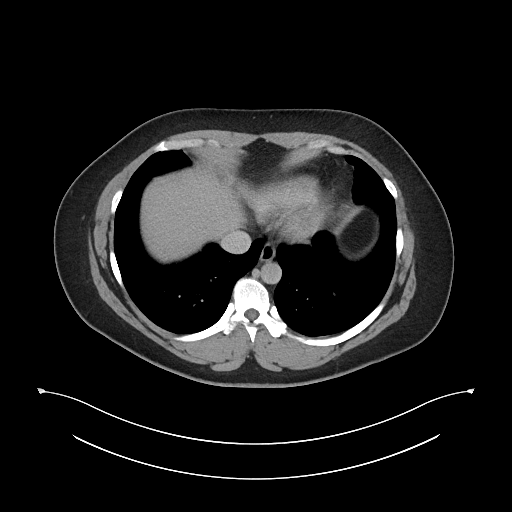
[im 99/105  soft-tissue]
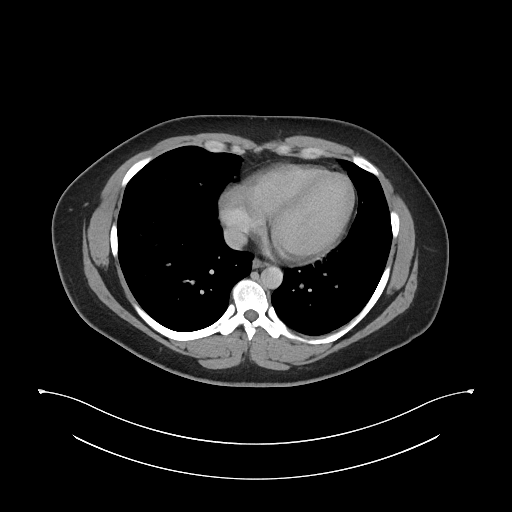

[Series 6: abdomen 3.0 mpr cor · coronal · 0.93mm/px · 3 of 101 slices shown]
[im 34/101  soft-tissue]
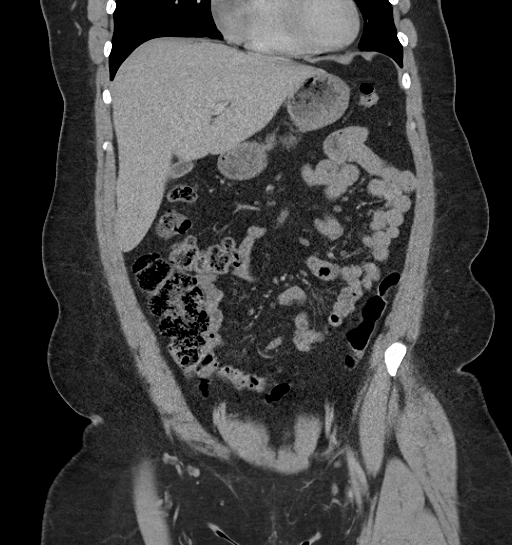
[im 45/101  soft-tissue]
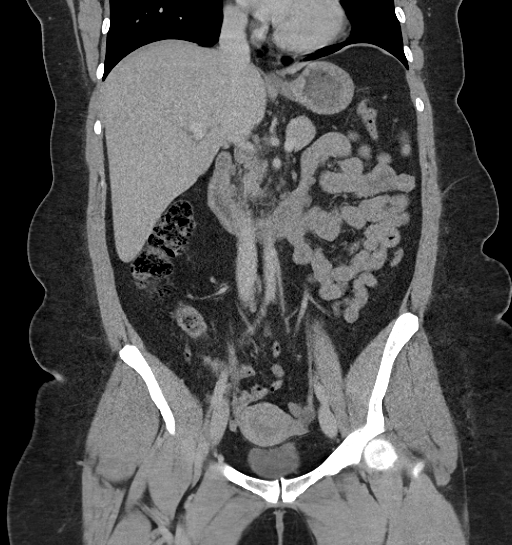
[im 56/101  soft-tissue]
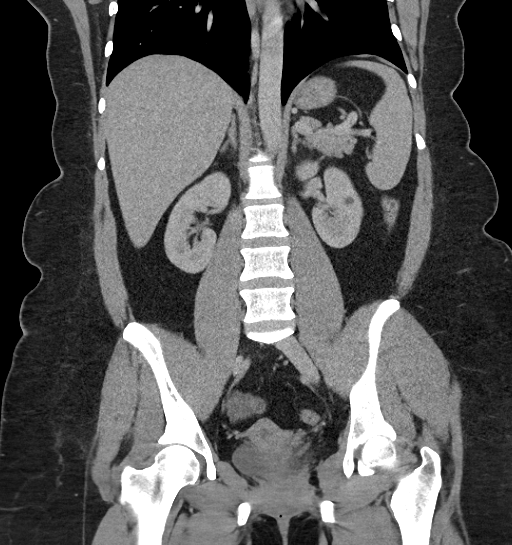

[17 of 46 positions shown; findings below may reference images not displayed]

FINDINGS: LOWER CHEST: Lung bases are clear. Included heart size is normal. No
pericardial effusion.

HEPATOBILIARY: 5 mm hypodensity RIGHT lobe of the liver, possible
cyst. Contracted nonacute gallbladder.

PANCREAS: Normal.

SPLEEN: Normal.

ADRENALS/URINARY TRACT: Kidneys are orthotopic, demonstrating
symmetric enhancement. No nephrolithiasis, hydronephrosis or solid
renal masses. 11 mm LEFT interpolar cyst. The unopacified ureters
are normal in course and caliber. Urinary bladder is partially
distended and unremarkable. Normal adrenal glands.

STOMACH/BOWEL: Tiny hiatal hernia. The stomach, small and large
bowel are normal in course and caliber without inflammatory changes.
A few scattered colonic diverticulosis seen. Normal appendix.

VASCULAR/LYMPHATIC: Aortoiliac vessels are normal in course and
caliber. Greater than expected number of small mesenteric lymph
nodes are similar, likely reactive. No lymphadenopathy by CT size
criteria.

REPRODUCTIVE: Normal.

OTHER: No intraperitoneal free fluid or free air.

MUSCULOSKELETAL: Nonacute.  Negative lumbar spine.
IMPRESSION: 1. Mild amount of retained large bowel stool, no bowel obstruction.
2. No acute intra-abdominal or pelvic process. Minimal colonic
diverticulosis.

## 2019-04-05 NOTE — L&D Delivery Note (Signed)
Delivery Note Pt labored to complete with no pain medication or augmentation. She reported urge to push. Pt pushed twice and at 12:44 AM a viable female was delivered via Vaginal, Spontaneous (Presentation: Right Occiput Anterior).  APGAR: 8, 9; weight pending . Loose nuchal x 1 reduced over infants head. Anterior and posterior shoulders delivered with the next push, body followed.  Infant placed on mothers abdomen and after a minute delay, cord clamped, FOB cut.  Cord blood was obtained.    Placenta status: Spontaneous, Intact. Tomasa Blase. Cord: 3 vessels with the following complications: None.  Cord pH: n/a  Anesthesia: Local Episiotomy: None Lacerations: 2nd degree;Periurethral;Vaginal Suture Repair: 2.0 vicryl and 4-0 vicryl Est. Blood Loss (mL): 103  Mom to postpartum.  Baby to Couplet care / Skin to Skin.  Cathrine Muster 01/29/2020, 1:27 AM

## 2019-06-27 LAB — OB RESULTS CONSOLE HEPATITIS B SURFACE ANTIGEN: Hepatitis B Surface Ag: NEGATIVE

## 2019-06-27 LAB — OB RESULTS CONSOLE RPR: RPR: NONREACTIVE

## 2019-06-27 LAB — OB RESULTS CONSOLE GC/CHLAMYDIA
Chlamydia: NEGATIVE
Gonorrhea: NEGATIVE

## 2019-06-27 LAB — OB RESULTS CONSOLE ANTIBODY SCREEN: Antibody Screen: NEGATIVE

## 2019-06-27 LAB — OB RESULTS CONSOLE ABO/RH: RH Type: POSITIVE

## 2019-06-27 LAB — OB RESULTS CONSOLE RUBELLA ANTIBODY, IGM: Rubella: IMMUNE

## 2019-06-27 LAB — OB RESULTS CONSOLE HIV ANTIBODY (ROUTINE TESTING): HIV: NONREACTIVE

## 2019-11-27 ENCOUNTER — Encounter: Payer: Self-pay | Admitting: Dietician

## 2019-11-27 ENCOUNTER — Other Ambulatory Visit: Payer: Self-pay

## 2019-11-27 ENCOUNTER — Encounter: Payer: 59 | Attending: Obstetrics and Gynecology | Admitting: Dietician

## 2019-11-27 DIAGNOSIS — O24410 Gestational diabetes mellitus in pregnancy, diet controlled: Secondary | ICD-10-CM

## 2019-11-28 ENCOUNTER — Encounter: Payer: Self-pay | Admitting: Dietician

## 2019-11-28 NOTE — Progress Notes (Signed)
Patient was seen on 11/27/2019 for Gestational Diabetes Self-Management class at the [facility] office. The following learning objectives were met by the patient during this course:   States the definition of Gestational Diabetes  States why dietary management is important in controlling blood glucose  Describes the effects each nutrient has on blood glucose levels  Demonstrates ability to create a balanced meal plan  Demonstrates carbohydrate counting   States when to check blood glucose levels  Demonstrates proper blood glucose monitoring techniques  States the effect of stress and exercise on blood glucose levels  States the importance of limiting caffeine and abstaining from alcohol and smoking  Blood glucose monitor given: N/A - patient already has Care Touch  Blood glucose reading: 90  Patient instructed to monitor glucose levels: FBS: 60 - <95; 1 hour: <140; 2 hour: <120  Patient received handouts:  Nutrition Diabetes and Pregnancy  Patient will be seen for follow-up as needed. 

## 2020-01-02 LAB — OB RESULTS CONSOLE GBS: GBS: NEGATIVE

## 2020-01-23 ENCOUNTER — Encounter (HOSPITAL_COMMUNITY): Payer: Self-pay | Admitting: *Deleted

## 2020-01-23 ENCOUNTER — Telehealth (HOSPITAL_COMMUNITY): Payer: Self-pay | Admitting: *Deleted

## 2020-01-23 NOTE — Telephone Encounter (Signed)
Preadmission screen  

## 2020-01-24 ENCOUNTER — Telehealth (HOSPITAL_COMMUNITY): Payer: Self-pay | Admitting: *Deleted

## 2020-01-24 NOTE — Telephone Encounter (Signed)
Preadmission screen  

## 2020-01-27 ENCOUNTER — Telehealth (HOSPITAL_COMMUNITY): Payer: Self-pay | Admitting: *Deleted

## 2020-01-27 NOTE — Telephone Encounter (Signed)
Preadmission screen  

## 2020-01-28 ENCOUNTER — Other Ambulatory Visit (HOSPITAL_COMMUNITY): Admission: RE | Admit: 2020-01-28 | Payer: 59 | Source: Ambulatory Visit

## 2020-01-28 ENCOUNTER — Other Ambulatory Visit: Payer: Self-pay

## 2020-01-28 ENCOUNTER — Inpatient Hospital Stay (HOSPITAL_COMMUNITY)
Admission: AD | Admit: 2020-01-28 | Discharge: 2020-01-31 | DRG: 807 | Disposition: A | Discharge status: Home / Self Care | Payer: 59 | Attending: Obstetrics and Gynecology | Admitting: Obstetrics and Gynecology

## 2020-01-28 ENCOUNTER — Encounter (HOSPITAL_COMMUNITY): Payer: Self-pay | Admitting: Obstetrics and Gynecology

## 2020-01-28 DIAGNOSIS — Z20822 Contact with and (suspected) exposure to covid-19: Secondary | ICD-10-CM | POA: Diagnosis present

## 2020-01-28 DIAGNOSIS — O99893 Other specified diseases and conditions complicating puerperium: Secondary | ICD-10-CM | POA: Diagnosis present

## 2020-01-28 DIAGNOSIS — M25552 Pain in left hip: Secondary | ICD-10-CM | POA: Diagnosis present

## 2020-01-28 DIAGNOSIS — O24425 Gestational diabetes mellitus in childbirth, controlled by oral hypoglycemic drugs: Secondary | ICD-10-CM | POA: Diagnosis present

## 2020-01-28 DIAGNOSIS — O99334 Smoking (tobacco) complicating childbirth: Secondary | ICD-10-CM | POA: Diagnosis present

## 2020-01-28 DIAGNOSIS — F17200 Nicotine dependence, unspecified, uncomplicated: Secondary | ICD-10-CM | POA: Diagnosis present

## 2020-01-28 DIAGNOSIS — Z3A39 39 weeks gestation of pregnancy: Secondary | ICD-10-CM | POA: Diagnosis not present

## 2020-01-28 DIAGNOSIS — O26893 Other specified pregnancy related conditions, third trimester: Secondary | ICD-10-CM | POA: Diagnosis present

## 2020-01-28 HISTORY — DX: Gestational diabetes mellitus in pregnancy, unspecified control: O24.419

## 2020-01-28 LAB — RESPIRATORY PANEL BY RT PCR (FLU A&B, COVID)
Influenza A by PCR: NEGATIVE
Influenza B by PCR: NEGATIVE
SARS Coronavirus 2 by RT PCR: NEGATIVE

## 2020-01-28 LAB — CBC
HCT: 36.8 % (ref 36.0–46.0)
Hemoglobin: 11.3 g/dL — ABNORMAL LOW (ref 12.0–15.0)
MCH: 23.9 pg — ABNORMAL LOW (ref 26.0–34.0)
MCHC: 30.7 g/dL (ref 30.0–36.0)
MCV: 77.8 fL — ABNORMAL LOW (ref 80.0–100.0)
Platelets: 287 10*3/uL (ref 150–400)
RBC: 4.73 MIL/uL (ref 3.87–5.11)
RDW: 16.5 % — ABNORMAL HIGH (ref 11.5–15.5)
WBC: 16.9 10*3/uL — ABNORMAL HIGH (ref 4.0–10.5)
nRBC: 0 % (ref 0.0–0.2)

## 2020-01-28 LAB — GLUCOSE, CAPILLARY
Glucose-Capillary: 77 mg/dL (ref 70–99)
Glucose-Capillary: 107 mg/dL — ABNORMAL HIGH (ref 70–99)

## 2020-01-28 LAB — POCT FERN TEST: POCT Fern Test: POSITIVE — AB

## 2020-01-28 LAB — TYPE AND SCREEN
ABO/RH(D): O POS
Antibody Screen: NEGATIVE

## 2020-01-28 MED ORDER — ONDANSETRON HCL 4 MG/2ML IJ SOLN: 4.0000 mg | Freq: Four times a day (QID) | INTRAMUSCULAR | Status: DC | PRN

## 2020-01-28 MED ORDER — OXYTOCIN-SODIUM CHLORIDE 30-0.9 UT/500ML-% IV SOLN
2.5000 [IU]/h | INTRAVENOUS | Status: DC
  Administered 2020-01-29: 2.5 [IU]/h via INTRAVENOUS
  Filled 2020-01-28: qty 500

## 2020-01-28 MED ORDER — FLEET ENEMA 7-19 GM/118ML RE ENEM: 1.0000 | ENEMA | RECTAL | Status: DC | PRN

## 2020-01-28 MED ORDER — LACTATED RINGERS IV SOLN: 500.0000 mL | INTRAVENOUS | Status: DC | PRN

## 2020-01-28 MED ORDER — OXYCODONE-ACETAMINOPHEN 5-325 MG PO TABS: 2.0000 | ORAL_TABLET | ORAL | Status: DC | PRN

## 2020-01-28 MED ORDER — LACTATED RINGERS IV SOLN: INTRAVENOUS | Status: DC

## 2020-01-28 MED ORDER — SOD CITRATE-CITRIC ACID 500-334 MG/5ML PO SOLN: 30.0000 mL | ORAL | Status: DC | PRN

## 2020-01-28 MED ORDER — OXYTOCIN BOLUS FROM INFUSION
333.0000 mL | Freq: Once | INTRAVENOUS | Status: AC
  Administered 2020-01-29: 333 mL via INTRAVENOUS

## 2020-01-28 MED ORDER — LIDOCAINE HCL (PF) 1 % IJ SOLN
30.0000 mL | INTRAMUSCULAR | Status: AC | PRN
  Administered 2020-01-29: 30 mL via SUBCUTANEOUS
  Filled 2020-01-28: qty 30

## 2020-01-28 MED ORDER — ACETAMINOPHEN 325 MG PO TABS: 650.0000 mg | ORAL_TABLET | ORAL | Status: DC | PRN

## 2020-01-28 MED ORDER — OXYCODONE-ACETAMINOPHEN 5-325 MG PO TABS: 1.0000 | ORAL_TABLET | ORAL | Status: DC | PRN

## 2020-01-28 NOTE — MAU Note (Signed)
Pt stated her water broke about 6 am. Fluid is light green colored. Reports ctx q 5 min and good fetal movement. 1cm in office yesterday.

## 2020-01-28 NOTE — H&P (Signed)
Claire Jackson is a 39 y.o.prime female presenting for SROM at 63 6/7wks . Pt reports noting SROM at 6am today; confirmed in MAU.  Pt is dated per LMP which was confirmed with a 9weeks [place]. Her pregnancy was complicated by AMA, GDMA2, BMI 42. She is GBS negative. She received covid, flu and Tdap vaccines.  Panorama and horizon negative OB History    Gravida  1   Para      Term      Preterm      AB      Living        SAB      TAB      Ectopic      Multiple      Live Births             Past Medical History:  Diagnosis Date  . Gestational diabetes   . Heart rate fast    Past Surgical History:  Procedure Laterality Date  . HERNIA REPAIR     umbilical and inguinal repair   Family History: family history includes Mitral valve prolapse in her mother. Social History:  reports that she has been smoking. She has never used smokeless tobacco. She reports that she does not drink alcohol and does not use drugs.     Maternal Diabetes: Yes:  Diabetes Type:  Insulin/Medication controlled Genetic Screening: Normal Maternal Ultrasounds/Referrals: Normal Fetal Ultrasounds or other Referrals:  None Maternal Substance Abuse:  No Significant Maternal Medications:  Meds include: Other: see HPI Significant Maternal Lab Results:  Group B Strep negative Other Comments:  None  Review of Systems  Constitutional: Positive for activity change. Negative for fatigue.  Eyes: Negative for photophobia and visual disturbance.  Respiratory: Negative for chest tightness and shortness of breath.   Cardiovascular: Negative for chest pain, palpitations and leg swelling.  Genitourinary: Positive for pelvic pain.  Neurological: Negative for dizziness and light-headedness.  Psychiatric/Behavioral: The patient is nervous/anxious.    Maternal Medical History:  Reason for admission: Rupture of membranes.   Contractions: Onset was 6-12 hours ago.   Frequency: regular.   Perceived severity is  moderate.    Fetal activity: Perceived fetal activity is normal.    Prenatal complications: no prenatal complications Prenatal Complications - Diabetes: gestational. Diabetes is managed by oral agent (monotherapy).      Dilation: 3.5 Effacement (%): 90 Station: -2 Exam by:: Mary [place] Johnson, RN  Blood pressure (!) 127/52, pulse 93, temperature 99.3 F (37.4 C), temperature source Oral, resp. rate 17, height 5\' 7"  (1.702 m), weight 131.1 kg. Maternal Exam:  Uterine Assessment: Contraction strength is moderate.  Contraction frequency is regular.   Abdomen: Patient reports generalized tenderness.  Estimated fetal weight is AGA.   Fetal presentation: vertex  Introitus: Normal vulva. Vulva is negative for condylomata and lesion.  Normal vagina.  Amniotic fluid character: clear.  Pelvis: adequate for delivery.   Cervix: Cervix evaluated by digital exam.     Fetal Exam Fetal Monitor Review: Baseline rate: 150.  Variability: moderate (6-25 bpm).   Pattern: accelerations present and no decelerations.    Fetal State Assessment: Category I - tracings are normal.     Physical Exam Vitals and nursing note reviewed. Exam conducted with a chaperone present.  Constitutional:      Appearance: Normal appearance.  Cardiovascular:     Pulses: Normal pulses.  Pulmonary:     Effort: Pulmonary effort is normal.  Abdominal:     Tenderness: There is generalized abdominal  tenderness.  Genitourinary:    General: Normal vulva.  Vulva is no lesion.  Musculoskeletal:        General: Normal range of motion.  Skin:    General: Skin is warm.  Neurological:     General: No focal deficit present.     Mental Status: She is alert.  Psychiatric:        Mood and Affect: Mood normal.        Behavior: Behavior normal.        Thought Content: Thought content normal.        Judgment: Judgment normal.     Prenatal labs: ABO, Rh: --/--/O POS (10/26 1120) Antibody: NEG (10/26  1120) Rubella: Immune (03/25 0000) RPR: Nonreactive (03/25 0000)  HBsAg: Negative (03/25 0000)  HIV: Non-reactive (03/25 0000)  GBS: Negative/-- (09/30 0000)   Assessment/Plan: Late entry note Pt is a 39yo prime at 76 6/7wks admitted for SROM - latent labor Pt laboring spontaneously - now 4cm dilated GDMA2 - CBG q 4 Pain control prn GBS neg Covid neg Anticipate svd    Claire Jackson 01/28/2020, 7:52 PM

## 2020-01-29 ENCOUNTER — Encounter (HOSPITAL_COMMUNITY): Payer: Self-pay | Admitting: Obstetrics and Gynecology

## 2020-01-29 LAB — CBC
HCT: 31.9 % — ABNORMAL LOW (ref 36.0–46.0)
Hemoglobin: 10 g/dL — ABNORMAL LOW (ref 12.0–15.0)
MCH: 24.1 pg — ABNORMAL LOW (ref 26.0–34.0)
MCHC: 31.3 g/dL (ref 30.0–36.0)
MCV: 76.9 fL — ABNORMAL LOW (ref 80.0–100.0)
Platelets: 279 10*3/uL (ref 150–400)
RBC: 4.15 MIL/uL (ref 3.87–5.11)
RDW: 16.4 % — ABNORMAL HIGH (ref 11.5–15.5)
WBC: 25.1 10*3/uL — ABNORMAL HIGH (ref 4.0–10.5)
nRBC: 0 % (ref 0.0–0.2)

## 2020-01-29 LAB — RPR: RPR Ser Ql: NONREACTIVE

## 2020-01-29 MED ORDER — WITCH HAZEL-GLYCERIN EX PADS: 1.0000 "application " | MEDICATED_PAD | CUTANEOUS | Status: DC | PRN

## 2020-01-29 MED ORDER — DIBUCAINE (PERIANAL) 1 % EX OINT: 1.0000 "application " | TOPICAL_OINTMENT | CUTANEOUS | Status: DC | PRN

## 2020-01-29 MED ORDER — ONDANSETRON HCL 4 MG/2ML IJ SOLN: 4.0000 mg | INTRAMUSCULAR | Status: DC | PRN

## 2020-01-29 MED ORDER — OXYCODONE HCL 5 MG PO TABS: 10.0000 mg | ORAL_TABLET | ORAL | Status: DC | PRN

## 2020-01-29 MED ORDER — ZOLPIDEM TARTRATE 5 MG PO TABS: 5.0000 mg | ORAL_TABLET | Freq: Every evening | ORAL | Status: DC | PRN

## 2020-01-29 MED ORDER — OXYCODONE HCL 5 MG PO TABS: 5.0000 mg | ORAL_TABLET | ORAL | Status: DC | PRN

## 2020-01-29 MED ORDER — ACETAMINOPHEN 325 MG PO TABS
650.0000 mg | ORAL_TABLET | ORAL | Status: DC | PRN
  Administered 2020-01-29: 650 mg via ORAL
  Filled 2020-01-29: qty 2

## 2020-01-29 MED ORDER — IBUPROFEN 600 MG PO TABS
600.0000 mg | ORAL_TABLET | Freq: Four times a day (QID) | ORAL | Status: DC
  Administered 2020-01-29 – 2020-01-31 (×10): 600 mg via ORAL
  Filled 2020-01-29 (×11): qty 1

## 2020-01-29 MED ORDER — SIMETHICONE 80 MG PO CHEW: 80.0000 mg | CHEWABLE_TABLET | ORAL | Status: DC | PRN

## 2020-01-29 MED ORDER — BENZOCAINE-MENTHOL 20-0.5 % EX AERO
1.0000 "application " | INHALATION_SPRAY | CUTANEOUS | Status: DC | PRN
  Administered 2020-01-29: 1 via TOPICAL
  Filled 2020-01-29: qty 56

## 2020-01-29 MED ORDER — DIPHENHYDRAMINE HCL 25 MG PO CAPS: 25.0000 mg | ORAL_CAPSULE | Freq: Four times a day (QID) | ORAL | Status: DC | PRN

## 2020-01-29 MED ORDER — SENNOSIDES-DOCUSATE SODIUM 8.6-50 MG PO TABS
2.0000 | ORAL_TABLET | ORAL | Status: DC
  Administered 2020-01-29 – 2020-01-31 (×2): 2 via ORAL
  Filled 2020-01-29 (×2): qty 2

## 2020-01-29 MED ORDER — ONDANSETRON HCL 4 MG PO TABS: 4.0000 mg | ORAL_TABLET | ORAL | Status: DC | PRN

## 2020-01-29 MED ORDER — TETANUS-DIPHTH-ACELL PERTUSSIS 5-2.5-18.5 LF-MCG/0.5 IM SUSY: 0.5000 mL | PREFILLED_SYRINGE | Freq: Once | INTRAMUSCULAR | Status: DC

## 2020-01-29 MED ORDER — COCONUT OIL OIL
1.0000 "application " | TOPICAL_OIL | Status: DC | PRN
  Administered 2020-01-29: 1 via TOPICAL

## 2020-01-29 MED ORDER — PRENATAL MULTIVITAMIN CH
1.0000 | ORAL_TABLET | Freq: Every day | ORAL | Status: DC
  Administered 2020-01-30 – 2020-01-31 (×2): 1 via ORAL
  Filled 2020-01-29 (×2): qty 1

## 2020-01-29 MED ORDER — METHOCARBAMOL 500 MG PO TABS
500.0000 mg | ORAL_TABLET | Freq: Two times a day (BID) | ORAL | Status: DC
  Administered 2020-01-31 (×2): 500 mg via ORAL
  Filled 2020-01-29 (×6): qty 1

## 2020-01-29 NOTE — Progress Notes (Signed)
Post Partum Day 0 Subjective: no complaints, up ad lib, voiding, tolerating PO and nl lochia, pain controlled  Objective: Blood pressure 111/62, pulse 77, temperature 98.4 F (36.9 C), temperature source Oral, resp. rate 16, height 5\' 7"  (1.702 m), weight 131.1 kg, SpO2 100 %, unknown if currently breastfeeding.  Physical Exam:  General: alert and no distress Lochia: appropriate Uterine Fundus: firm   Recent Labs    01/28/20 1120 01/29/20 0316  HGB 11.3* 10.0*  HCT 36.8 31.9*    Assessment/Plan: Breastfeeding and Lactation consult.  Routine PP care.    LOS: 1 day   Claire Jackson 01/29/2020, 8:33 AM

## 2020-01-29 NOTE — Plan of Care (Signed)
L&D careplan completed 

## 2020-01-29 NOTE — Lactation Note (Signed)
This note was copied from a baby's chart. Lactation Consultation Note Baby 4 hrs old. RN called LC d/t baby has low glucose level. Mom has GDM baby's glucose 32. Mom holding baby STS on her chest. Baby covered on back. LC hand expressed 3 ml colostrum from mom spoon fed baby. Baby sleepy. Newborn behavior, STS, I&O, breast massage, supply and demand.  Offered to set up DEBP for mom. Mom declined at this time. Mom will be returning back to work at 6 weeks. Mom has DEBP at home.  Mom has Large soft pendulous breast. Encouraged to wear bra today. Suggested to mom that might benefit from ordering a pair of shells to wear in bra to keep nipples everted and breast tissue everted.  Encouraged mom to call for assistance or questions. Encouraged to wake baby up in 3 hrs to attempt feeding. If baby is cueing then need to feed the baby. If baby hasn't cued in 3 hrs then feed the baby. Mom encouraged to feed baby 8-12 times/24 hours and with feeding cues.   Encouraged to call for assistance or questions. Lactation brochure given.  Patient Name: Claire Jackson JGGEZ'M Date: 01/29/2020 Reason for consult: Initial assessment;Term;Maternal endocrine disorder;Primapara Type of Endocrine Disorder?: Diabetes   Maternal Data Has patient been taught Hand Expression?: Yes Does the patient have breastfeeding experience prior to this delivery?: No  Feeding Feeding Type: Breast Milk  LATCH Score Latch: Too sleepy or reluctant, no latch achieved, no sucking elicited.  Audible Swallowing: None  Type of Nipple: Flat  Comfort (Breast/Nipple): Soft / non-tender  Hold (Positioning): Full assist, staff holds infant at breast  LATCH Score: 3  Interventions Interventions: Breast feeding basics reviewed;Support pillows;Assisted with latch;Position options;Skin to skin;Expressed milk;Breast massage;Coconut oil;Hand express;Breast compression;Adjust position  Lactation Tools Discussed/Used Tools:  Coconut oil WIC Program: No   Consult Status Consult Status: Follow-up Date: 01/29/20 Follow-up type: In-patient    [name], Diamond Nickel 01/29/2020, 5:02 AM

## 2020-01-30 ENCOUNTER — Inpatient Hospital Stay (HOSPITAL_COMMUNITY): Payer: 59

## 2020-01-30 ENCOUNTER — Inpatient Hospital Stay (HOSPITAL_COMMUNITY): Admission: AD | Admit: 2020-01-30 | Payer: 59 | Source: Home / Self Care | Admitting: Obstetrics and Gynecology

## 2020-01-30 NOTE — Lactation Note (Signed)
This note was copied from a baby's chart. Lactation Consultation Note Baby 25 hrs old. Was transferred to NICU yesterday d/t hypoglycemia. Mom has GDM.  F/u w/mom to see how pumping was going. Mom stated she wasn't getting anything when she pumped. LC reviewed how she needs to pump for stimulation as if she was feeding the baby every 3 hrs. Milk storage for NICU babies reviewed.  LC took mom some colostrum containers and pack of disposable bottles.  Mom appreciative. Mom resting, lights out, bathroom door cracked. encouraged to call for questions or concerns.  Patient Name: Claire Jackson [id number] Date: 01/30/2020 Reason for consult: Follow-up assessment;NICU baby;Primapara;Term;Maternal endocrine disorder Type of Endocrine Disorder?: Diabetes   Maternal Data    Feeding Feeding Type: Formula  LATCH Score                   Interventions    Lactation Tools Discussed/Used     Consult Status Consult Status: Follow-up Date: 01/31/20 Follow-up type: In-patient    Hesper Venturella, Diamond Nickel 01/30/2020, 1:44 AM

## 2020-01-30 NOTE — Progress Notes (Signed)
Patient screened out for psychosocial assessment since none of the following apply: °Psychosocial stressors documented in mother or baby's chart °Gestation less than 32 weeks °Code at delivery  °Infant with anomalies °Please contact the Clinical Social Worker if specific needs arise, by MOB's request, or if MOB scores greater than 9/yes to question 10 on Edinburgh Postpartum Depression Screen. ° °Oseph Imburgia Boyd-Gilyard, MSW, LCSW °Clinical Social Work °[phone] °  °

## 2020-01-30 NOTE — Lactation Note (Signed)
This note was copied from a baby's chart. Lactation Consultation Note  Patient Name: Claire Jackson [id number] Date: 01/30/2020 Reason for consult: Follow-up assessment;Primapara;1st time breastfeeding;NICU baby;Term;Maternal endocrine disorder Type of Endocrine Disorder?: Diabetes  LC in to visit with P1 Mom in her room on 5th floor MB.  Mom resting in chair on her phone.  Talked to Mom about how she feels pumping was going.  Mom states "I have the pumping down".  Encouraged Mom to pump every 2-3 hrs during the day and 3-4 hrs at night.  Mom states pumping is comfortable and denied any questions.  Reviewed importance of disassembling pump parts, washing, rinsing and air drying parts in separate bin.    Encouraged Mom to pump at baby's bedside.  Mom plans to return to NICU for next pumping.    Mom has a Dr. Theora Gianotti DEBP at home.  Encouraged Mom to use the hospital grade DEBP in baby's room to support her milk supply.   Reassured Mom that assistance would be available as baby starts cueing to feed.  Mom to ask her RN to call LC prn.   Interventions Interventions: Breast feeding basics reviewed;Skin to skin;Breast massage;Hand express;DEBP  Lactation Tools Discussed/Used Tools: Pump;Bottle Breast pump type: Double-Electric Breast Pump Pump Review: Setup, frequency, and cleaning;Milk Storage Initiated by:: LC Date initiated:: 01/30/20   Consult Status Consult Status: Follow-up Date: 01/31/20 Follow-up type: In-patient    Carnella, Fryman 01/30/2020, 2:52 PM

## 2020-01-30 NOTE — Lactation Note (Addendum)
This note was copied from a baby's chart. Lactation Consultation Note  Patient Name: Girl Karmela Bram MCEYE'M Date: 01/30/2020 Reason for consult: Follow-up assessment;NICU baby;Maternal endocrine disorder;Primapara;1st time breastfeeding Type of Endocrine Disorder?: Diabetes  LC called to room to assist with bf. Baby frantic with high pitched scream. No ability to latch at this time: currently being gavage fed. Placed sts and observed for several minutes until baby calmed. Notified RN. Offered to return to assist when baby is showing feeding cues.    LATCH Score Latch: Too sleepy or reluctant, no latch achieved, no sucking elicited.  Audible Swallowing: None  Type of Nipple: Everted at rest and after stimulation  Comfort (Breast/Nipple): Soft / non-tender  Hold (Positioning): Full assist, staff holds infant at breast  LATCH Score: 4  Interventions Interventions: Breast feeding basics reviewed;Support pillows;Assisted with latch;Position options;Skin to skin;Expressed milk;Hand express;Adjust position;DEBP  Consult Status Consult Status: Follow-up Date: 01/31/20 Follow-up type: In-patient    Elder Negus 01/30/2020, 12:34 PM

## 2020-01-30 NOTE — Progress Notes (Signed)
Post Partum Day 1  Subjective: no complaints and tolerating PO   Trying to pump and getting more volume  Objective: Blood pressure 128/69, pulse 94, temperature 98.2 F (36.8 C), temperature source Oral, resp. rate 20, height 5\' 7"  (1.702 m), weight 131.1 kg, SpO2 100 %, unknown if currently breastfeeding.  Physical Exam:  General: alert and cooperative Lochia: appropriate Uterine Fundus: firm   Recent Labs    01/28/20 1120 01/29/20 0316  HGB 11.3* 10.0*  HCT 36.8 31.9*    Assessment/Plan: Plan for discharge tomorrow  Baby in NICU for poor feeding and low BS--working with lactation     LOS: 2 days   01/31/20 01/30/2020, 10:28 AM

## 2020-01-31 MED ORDER — ABDOMINAL BINDER/ELASTIC XL MISC: 1.0000 [IU] | 0 refills | Status: AC | PRN

## 2020-01-31 MED ORDER — METHOCARBAMOL 500 MG PO TABS: 500.0000 mg | ORAL_TABLET | Freq: Two times a day (BID) | ORAL | 0 refills | Status: DC

## 2020-01-31 MED ORDER — IBUPROFEN 600 MG PO TABS: 600.0000 mg | ORAL_TABLET | Freq: Four times a day (QID) | ORAL | 1 refills | Status: AC | PRN

## 2020-01-31 MED ORDER — METHOCARBAMOL 500 MG PO TABS: 500.0000 mg | ORAL_TABLET | Freq: Two times a day (BID) | ORAL | 1 refills | Status: AC

## 2020-01-31 MED ORDER — METHOCARBAMOL 500 MG PO TABS: 500.0000 mg | ORAL_TABLET | Freq: Two times a day (BID) | ORAL | 0 refills | Status: AC

## 2020-01-31 NOTE — Discharge Summary (Signed)
Postpartum Discharge Summary  Date of Service updated      Patient Name: Claire Jackson DOB: 1980/05/17 MRN: [id number]  Date of admission: 01/28/2020 Delivery date:01/29/2020  Delivering provider: Carlynn Purl [facility]  Date of discharge: 01/31/2020  Admitting diagnosis: Indication for care in labor or delivery [O75.9] Intrauterine pregnancy: [redacted]w[redacted]d    Secondary diagnosis:  Active Problems:   Indication for care in labor or delivery  Additional problems: none    Discharge diagnosis: Term Pregnancy Delivered and GDM A2                                              Post partum procedures:n/a Augmentation: N/A Complications: None  Hospital course: Onset of Labor With Vaginal Delivery      39y.o. yo G1P1001 at 472w0das admitted in Active Labor on 01/28/2020. Patient had an uncomplicated labor course as follows:  Membrane Rupture Time/Date: 6:00 AM ,01/28/2020   Delivery Method:Vaginal, Spontaneous  Episiotomy: None  Lacerations:  2nd degree;Periurethral;Vaginal  Patient had an uncomplicated postpartum course.  She is ambulating, tolerating a regular diet, passing flatus, and urinating well. Patient is discharged home in stable condition on 01/31/20.  Newborn Data: Birth date:01/29/2020  Birth time:12:44 AM  Gender:Female  Living status:Living  Apgars:8 ,9  Weight:3274 g   Magnesium Sulfate received: No BMZ received: No Rhophylac:N/A MMR:N/A T-DaP:Given prenatally Flu: Yes Transfusion:No  Physical exam  Vitals:   01/30/20 0611 01/31/20 0048 01/31/20 1122 01/31/20 1523  BP: 128/69 129/76 (!) 141/73 120/75  Pulse: 94 94 81 93  Resp: 20 18 18 16   Temp: 98.2 F (36.8 C) 97.9 F (36.6 C) 98.4 F (36.9 C) 98.5 F (36.9 C)  TempSrc: Oral Oral Axillary Oral  SpO2: 100% 100% 99% 100%  Weight:      Height:       General: alert, cooperative and no distress Lochia: appropriate Uterine Fundus: firm Incision: N/A DVT Evaluation: No evidence of DVT seen on  physical exam. Labs: Lab Results  Component Value Date   WBC 25.1 (H) 01/29/2020   HGB 10.0 (L) 01/29/2020   HCT 31.9 (L) 01/29/2020   MCV 76.9 (L) 01/29/2020   PLT 279 01/29/2020   CMP Latest Ref Rng & Units 01/05/2017  Glucose 65 - 99 mg/dL 136(H)  BUN 6 - 20 mg/dL 10  Creatinine 0.44 - 1.00 mg/dL 0.98  Sodium 135 - 145 mmol/L 136  Potassium 3.5 - 5.1 mmol/L 3.9  Chloride 101 - 111 mmol/L 105  CO2 22 - 32 mmol/L 22  Calcium 8.9 - 10.3 mg/dL 8.9  Total Protein 6.5 - 8.1 g/dL 6.9  Total Bilirubin 0.3 - 1.2 mg/dL 0.5  Alkaline Phos 38 - 126 U/L 73  AST 15 - 41 U/L 19  ALT 14 - 54 U/L 16   Edinburgh Score: Edinburgh Postnatal Depression Scale Screening Tool 01/29/2020  I have been able to laugh and see the funny side of things. 0  I have looked forward with enjoyment to things. 0  I have blamed myself unnecessarily when things went wrong. 1  I have been anxious or worried for no good reason. 0  I have felt scared or panicky for no good reason. 1  Things have been getting on top of me. 0  I have been so unhappy that I have had difficulty sleeping. 0  I have felt  sad or miserable. 0  I have been so unhappy that I have been crying. 0  The thought of harming myself has occurred to me. 0  Edinburgh Postnatal Depression Scale Total 2      After visit meds:  Allergies as of 01/31/2020   No Known Allergies     Medication List    STOP taking these medications   aspirin 81 MG chewable tablet   metFORMIN 500 MG (MOD) 24 hr tablet Commonly known as: GLUMETZA     TAKE these medications   ibuprofen 600 MG tablet Commonly known as: ADVIL Take 1 tablet (600 mg total) by mouth every 6 (six) hours as needed for moderate pain or cramping.   methocarbamol 500 MG tablet Commonly known as: ROBAXIN Take 1 tablet (500 mg total) by mouth 2 (two) times daily.   naproxen 500 MG tablet Commonly known as: Naprosyn Take 1 tablet (500 mg total) by mouth 2 (two) times daily with a  meal.   prenatal multivitamin Tabs tablet Take 1 tablet by mouth daily at 12 noon.        Discharge home in stable condition Infant Feeding: Bottle and Breast Infant Disposition:NICU Discharge instruction: per After Visit Summary and Postpartum booklet. Activity: Advance as tolerated. Pelvic rest for 6 weeks.  Diet: carb modified diet Anticipated Birth Control: Unsure Postpartum Appointment:6 weeks Additional Postpartum F/U: 2 hour GTT Future Appointments:No future appointments. Follow up Visit:  [place], [place], DO. Schedule an appointment as soon as possible for a visit in 6 week(s).   Specialty: Obstetrics and Gynecology Why: For postpartum visit; will need a 2hr glucola screen at this visit Contact information: [place] [address] [phone]                   01/31/2020 Isaiah Serge, DO

## 2020-01-31 NOTE — Progress Notes (Signed)
Patient ID: Claire Jackson, female   DOB: [birth year], 39 y.o.   MRN: [id number] Pt in NICU. Will round on her later today

## 2020-01-31 NOTE — Progress Notes (Addendum)
   01/31/20 1122  Vital Signs  BP (!) 141/73  Pulse Rate 81  Resp 18  Temp 98.4 F (36.9 C)  Temp Source Axillary  Oxygen Therapy  SpO2 99 %  pt c/o dizziness while in NICU and while walking back from NICU. Pt has not eaten breakfast. Eating now. Advised pt to walk w wheelchair so she can sit down or ride down.   Pt states she feels better now since she started eating and had some juice

## 2020-01-31 NOTE — Lactation Note (Signed)
This note was copied from a baby's chart. Lactation Consultation Note  Patient Name: Claire Jackson [id number] Date: 01/31/2020 Reason for consult: Follow-up assessment;Primapara;1st time breastfeeding;NICU baby;Term;Maternal endocrine disorder [facility] requested a feeding assessment at 9 am with this NICU mother) Type of Endocrine Disorder?: Diabetes  Baby is 57 hours old last blood sugar - 72 , at 53 hours old - Bili 9.7 low intermediate.  As LC entered the room , mom working on latching with a #20 NS, latched in a laid back position. LC noted the baby to intermittently pulling back. Baby released,  Erect nipple noted and compressible areola, LC attempted to latch with out the NS and it was on and off , baby unable to sustain the latch.  LC resized mom and noted the #24 NS to fit well when rolled on like a sleeve.  Baby latched with depth and accommodated the #24 NS well and stayed latched for 10 mins with 1-2 swallows. Able to express several drops.  Per mom pumped x 1 over night and 3 time in the last 24 hours with 20 ml x 2 .  LC reviewed the maintenance setting so after pumping off 20 more ml she can switch .  Per mom probably will be D/C today and has a Dr. Frankey Shown at home.  LC encouraged mom to pump when visiting the baby in NICU and to increase to at least 8 times in 24 hours.  LC provided the comfort gels for sore nipples on the left.  LC recommended increasing her flange size to #27 .  Sore nipple and engorgement prevention and tx reviewed.  Mom aware she can be seen in NICU again by [facility].   Maternal Data Has patient been taught Hand Expression?: Yes  Feeding Feeding Type: Breast Fed  LATCH Score Latch: Repeated attempts needed to sustain latch, nipple held in mouth throughout feeding, stimulation needed to elicit sucking reflex.  Audible Swallowing: A few with stimulation  Type of Nipple: Everted at rest and after stimulation (some areola edema)  Comfort (Breast/Nipple):  Soft / non-tender  Hold (Positioning): Assistance needed to correctly position infant at breast and maintain latch.  LATCH Score: 7  Interventions Interventions: Breast feeding basics reviewed;Assisted with latch;Skin to skin;Breast massage;Hand express;Reverse pressure;Breast compression;Adjust position;Support pillows;Position options  Lactation Tools Discussed/Used Tools: Pump;Nipple Shields Nipple shield size: 24;20 (#24 NS fit well and baby pulled the nipple into the NS) Breast pump type: Double-Electric Breast Pump Pump Review: Milk Storage;Setup, frequency, and cleaning Initiated by:: MAI reviewed the maintanence mode on the pump since per mom has pumped off 20 ml x2 , and is aware once more 20 ml and she can switch Date initiated:: 01/31/20 (MAI reviewed)   Consult Status Consult Status: Follow-up Date: 02/01/20 Follow-up type: In-patient    Matilde Sprang Koni Kannan 01/31/2020, 10:23 AM

## 2020-01-31 NOTE — Discharge Instructions (Signed)
Call office with any concerns [phone] 

## 2020-01-31 NOTE — Progress Notes (Signed)
Post Partum Day 2 Subjective: up ad lib, voiding, tolerating PO and + flatus . She reports some mild left hip pain - improving with ambulation and use of muscle relaxant otherwise pain well controlled. Lochia mild. She denies HA, CP, SOB. She has been visiting with baby in NICU. Ready for discharge to home today.   Objective: Blood pressure 120/75, pulse 93, temperature 98.5 F (36.9 C), temperature source Oral, resp. rate 16, height 5\' 7"  (1.702 m), weight 131.1 kg, SpO2 100 %, unknown if currently breastfeeding.  Physical Exam:  General: alert, cooperative and no distress Lochia: appropriate Uterine Fundus: unable to examine as not in room Incision: n/a DVT Evaluation: No evidence of DVT seen on physical exam.  Recent Labs    01/29/20 0316  HGB 10.0*  HCT 31.9*    Assessment/Plan: Discharge home  Instructions reviewed.     LOS: 3 days   01/31/20 Claire Jackson 01/31/2020, 4:47 PM

## 2020-02-01 ENCOUNTER — Ambulatory Visit: Payer: Self-pay

## 2020-02-01 NOTE — Lactation Note (Signed)
This note was copied from a baby's chart. 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 [name] Claire Jackson is 23 hrs old and in NICU.  Baby took full feeding by bottle last 2 times and did well.     Mom has been pumping consistently and expressing 60-90 ml per pumping.    RN asked Mom to pre-pump an hour prior to feeding due to her large milk supply.  Breasts are heavy and nipples short shafted and large diameter.  24 mm nipple shield applied with instructions on use.  Mom has placed a shield herself and denies any difficulty.  Baby fussy, cueing.  Baby placed STS in cross cradle hold.  Mom laid back, but sat her head up once she attained a latch to breast.  Baby on and off the breast and Mom trying to be relaxed and soothing to baby.  Baby came off (unable to identify any milk in shield) and Mom burped baby and she started cueing again after a few minutes.  Baby placed in football hold.  Mom taught to use alternate breast compression to increase milk transfer.  LC did not identify swallows.  Instilled 10 ml EBM slowly using curved tip syringe while baby was sucking.  Deeper jaw extensions noted with swallows.  Baby fed with supplement for 15 more minutes.  Mom very comfortable with baby soothing her as she rhythmically sucked and swallowed.  FOB very involved.   Once supplement at breast was done, baby stopped sucking and Mom took her off.  When baby started cueing and fussing, offered to assist with putting baby back to breast, but Mom declined saying she would rather baby be supplemented by gavage.  Baby to get 50 ml of her 60 ml total.  Mom encouraged to pump AFTER breastfeeding, for full 15-30 mins.  Recommended  offering breast with cues, asking for help prn.  Mom aware of OP lactation support available to her and recommended she see Noralee Stain RN IBCLC at [facility] after discharge.      Feeding Feeding Type: Breast Fed  LATCH Score Latch: Repeated attempts needed to sustain latch, nipple held in mouth throughout feeding, stimulation needed to elicit sucking reflex.  Audible Swallowing: A few with stimulation (only when EBM instilled by syringe into nipple shield)  Type of Nipple: Everted at rest and after stimulation (short nipple shafts)  Comfort (Breast/Nipple): Soft / non-tender  Hold (Positioning): Assistance needed to correctly position infant at breast and maintain latch.  LATCH Score: 7  Interventions Interventions: Breast feeding basics reviewed;Assisted with latch;Skin to skin;Breast massage;Hand express;Breast compression;Adjust position;Support pillows;Position options;Expressed milk;DEBP  Lactation Tools Discussed/Used Tools: Nipple Dorris Carnes;Pump Nipple shield size: 24 Breast pump type: Double-Electric Breast Pump   Consult Status Consult Status: Follow-up Date: 02/02/20 Follow-up type: In-patient    Justina, Bertini 02/01/2020, 1:11 PM
